# Patient Record
Sex: Female | Born: 1955 | Race: White | Hispanic: No | Marital: Married | Smoking: Never smoker
Health system: Southern US, Community
[De-identification: ages and names within clinical notes are randomized; demographics above are authoritative.]

## PROBLEM LIST (undated history)

## (undated) DIAGNOSIS — R51 Headache: Secondary | ICD-10-CM

## (undated) DIAGNOSIS — F1020 Alcohol dependence, uncomplicated: Secondary | ICD-10-CM

## (undated) DIAGNOSIS — T7840XA Allergy, unspecified, initial encounter: Secondary | ICD-10-CM

## (undated) HISTORY — DX: Alcohol dependence, uncomplicated: F10.20

## (undated) HISTORY — DX: Headache: R51

## (undated) HISTORY — DX: Allergy, unspecified, initial encounter: T78.40XA

## (undated) HISTORY — PX: COLPOSCOPY: SHX161

## (undated) HISTORY — PX: NASAL SEPTUM SURGERY: SHX37

## (undated) HISTORY — PX: TONSILLECTOMY: SUR1361

---

## 2005-01-06 ENCOUNTER — Encounter: Payer: Self-pay | Admitting: Family Medicine

## 2005-06-18 ENCOUNTER — Emergency Department: Payer: Self-pay | Admitting: Emergency Medicine

## 2005-06-27 ENCOUNTER — Ambulatory Visit: Payer: Self-pay

## 2005-12-17 ENCOUNTER — Ambulatory Visit: Payer: Self-pay | Admitting: Family Medicine

## 2006-01-26 ENCOUNTER — Ambulatory Visit: Payer: Self-pay | Admitting: Family Medicine

## 2006-01-26 LAB — CONVERTED CEMR LAB
AST: 25 units/L (ref 0–37)
Alkaline Phosphatase: 60 units/L (ref 39–117)
Calcium: 9.5 mg/dL (ref 8.4–10.5)
Chloride: 107 meq/L (ref 96–112)
Cholesterol: 190 mg/dL (ref 0–200)
Creatinine, Ser: 0.9 mg/dL (ref 0.4–1.2)
GFR calc non Af Amer: 70 mL/min
HDL: 54.2 mg/dL (ref >39.0)
LDL Cholesterol: 122 mg/dL — ABNORMAL HIGH (ref 0–99)
Sodium: 141 meq/L (ref 135–145)

## 2006-01-30 ENCOUNTER — Ambulatory Visit: Payer: Self-pay | Admitting: Family Medicine

## 2006-01-30 ENCOUNTER — Encounter: Payer: Self-pay | Admitting: Family Medicine

## 2006-01-30 ENCOUNTER — Other Ambulatory Visit: Admission: RE | Admit: 2006-01-30 | Discharge: 2006-01-30 | Payer: Self-pay | Admitting: Family Medicine

## 2006-05-21 ENCOUNTER — Encounter: Payer: Self-pay | Admitting: Family Medicine

## 2006-05-21 DIAGNOSIS — R87619 Unspecified abnormal cytological findings in specimens from cervix uteri: Secondary | ICD-10-CM | POA: Insufficient documentation

## 2006-05-21 DIAGNOSIS — A630 Anogenital (venereal) warts: Secondary | ICD-10-CM | POA: Insufficient documentation

## 2006-05-21 DIAGNOSIS — J309 Allergic rhinitis, unspecified: Secondary | ICD-10-CM

## 2006-05-25 ENCOUNTER — Ambulatory Visit: Payer: Self-pay | Admitting: Family Medicine

## 2006-05-25 DIAGNOSIS — K2960 Other gastritis without bleeding: Secondary | ICD-10-CM | POA: Insufficient documentation

## 2006-05-25 DIAGNOSIS — L570 Actinic keratosis: Secondary | ICD-10-CM

## 2006-09-14 ENCOUNTER — Ambulatory Visit: Payer: Self-pay | Admitting: Family Medicine

## 2006-12-09 ENCOUNTER — Ambulatory Visit: Payer: Self-pay | Admitting: Family Medicine

## 2006-12-09 DIAGNOSIS — M25569 Pain in unspecified knee: Secondary | ICD-10-CM

## 2007-03-03 ENCOUNTER — Telehealth: Payer: Self-pay | Admitting: Family Medicine

## 2007-03-03 ENCOUNTER — Ambulatory Visit: Payer: Self-pay | Admitting: Family Medicine

## 2007-03-03 DIAGNOSIS — N949 Unspecified condition associated with female genital organs and menstrual cycle: Secondary | ICD-10-CM | POA: Insufficient documentation

## 2007-03-03 LAB — CONVERTED CEMR LAB
Blood in Urine, dipstick: NEGATIVE
Glucose, Urine, Semiquant: NEGATIVE
Nitrite: NEGATIVE
Protein, U semiquant: NEGATIVE
Urobilinogen, UA: NEGATIVE
WBC Urine, dipstick: NEGATIVE

## 2007-03-05 LAB — CONVERTED CEMR LAB: Chlamydia, DNA Probe: NEGATIVE

## 2007-03-12 ENCOUNTER — Encounter: Admission: RE | Admit: 2007-03-12 | Discharge: 2007-03-12 | Payer: Self-pay | Admitting: Family Medicine

## 2007-03-18 ENCOUNTER — Encounter: Admission: RE | Admit: 2007-03-18 | Discharge: 2007-03-18 | Payer: Self-pay | Admitting: Family Medicine

## 2007-03-19 ENCOUNTER — Ambulatory Visit: Payer: Self-pay | Admitting: Family Medicine

## 2007-10-07 ENCOUNTER — Ambulatory Visit: Payer: Self-pay | Admitting: Family Medicine

## 2008-02-02 ENCOUNTER — Ambulatory Visit: Payer: Self-pay | Admitting: Family Medicine

## 2008-02-02 DIAGNOSIS — R1013 Epigastric pain: Secondary | ICD-10-CM | POA: Insufficient documentation

## 2008-02-04 LAB — CONVERTED CEMR LAB
ALT: 21 units/L (ref 0–35)
AST: 27 units/L (ref 0–37)
Albumin: 3.9 g/dL (ref 3.5–5.2)
Alkaline Phosphatase: 73 units/L (ref 39–117)
Basophils Relative: 2.9 % (ref 0.0–3.0)
Calcium: 9.4 mg/dL (ref 8.4–10.5)
Eosinophils Absolute: 0.8 10*3/uL — ABNORMAL HIGH (ref 0.0–0.7)
GFR calc Af Amer: 84 mL/min
HCT: 39.4 % (ref 36.0–46.0)
Lymphocytes Relative: 30.4 % (ref 12.0–46.0)
MCHC: 33.4 g/dL (ref 30.0–36.0)
MCV: 86.3 fL (ref 78.0–100.0)
Monocytes Absolute: 0.6 10*3/uL (ref 0.1–1.0)
Monocytes Relative: 8.4 % (ref 3.0–12.0)
Neutro Abs: 3.1 10*3/uL (ref 1.4–7.7)
Platelets: 158 10*3/uL (ref 150–400)
Potassium: 4.5 meq/L (ref 3.5–5.1)
Total Protein: 6.6 g/dL (ref 6.0–8.3)

## 2008-03-06 ENCOUNTER — Ambulatory Visit: Payer: Self-pay | Admitting: Family Medicine

## 2008-03-07 ENCOUNTER — Telehealth: Payer: Self-pay | Admitting: Family Medicine

## 2008-04-12 ENCOUNTER — Ambulatory Visit: Payer: Self-pay | Admitting: Family Medicine

## 2008-04-12 DIAGNOSIS — A088 Other specified intestinal infections: Secondary | ICD-10-CM | POA: Insufficient documentation

## 2008-04-20 ENCOUNTER — Telehealth: Payer: Self-pay | Admitting: Family Medicine

## 2008-05-18 ENCOUNTER — Telehealth: Payer: Self-pay | Admitting: Family Medicine

## 2008-10-25 ENCOUNTER — Ambulatory Visit: Payer: Self-pay | Admitting: Family Medicine

## 2008-10-25 DIAGNOSIS — H109 Unspecified conjunctivitis: Secondary | ICD-10-CM | POA: Insufficient documentation

## 2008-11-03 ENCOUNTER — Telehealth: Payer: Self-pay | Admitting: Family Medicine

## 2008-11-09 ENCOUNTER — Ambulatory Visit: Payer: Self-pay | Admitting: Family Medicine

## 2008-11-15 ENCOUNTER — Telehealth: Payer: Self-pay | Admitting: Family Medicine

## 2008-11-15 DIAGNOSIS — H538 Other visual disturbances: Secondary | ICD-10-CM

## 2008-11-15 DIAGNOSIS — H5710 Ocular pain, unspecified eye: Secondary | ICD-10-CM | POA: Insufficient documentation

## 2008-12-19 ENCOUNTER — Ambulatory Visit: Payer: Self-pay | Admitting: Family Medicine

## 2008-12-19 DIAGNOSIS — J329 Chronic sinusitis, unspecified: Secondary | ICD-10-CM | POA: Insufficient documentation

## 2009-01-24 ENCOUNTER — Ambulatory Visit: Payer: Self-pay | Admitting: Family Medicine

## 2009-01-25 ENCOUNTER — Encounter: Payer: Self-pay | Admitting: Family Medicine

## 2009-01-25 LAB — CONVERTED CEMR LAB
AST: 27 units/L (ref 0–37)
Albumin: 4 g/dL (ref 3.5–5.2)
Alkaline Phosphatase: 71 units/L (ref 39–117)
Basophils Absolute: 0 10*3/uL (ref 0.0–0.1)
Basophils Relative: 0.5 % (ref 0.0–3.0)
Calcium: 9.4 mg/dL (ref 8.4–10.5)
Eosinophils Relative: 11 % — ABNORMAL HIGH (ref 0.0–5.0)
GFR calc non Af Amer: 79.45 mL/min (ref >60)
Hemoglobin: 13 g/dL (ref 12.0–15.0)
LDL Cholesterol: 119 mg/dL — ABNORMAL HIGH (ref 0–99)
Lymphocytes Relative: 37.5 % (ref 12.0–46.0)
Monocytes Relative: 9.4 % (ref 3.0–12.0)
Neutrophils Relative %: 41.6 % — ABNORMAL LOW (ref 43.0–77.0)
Platelets: 144 10*3/uL — ABNORMAL LOW (ref 150.0–400.0)
Potassium: 4.3 meq/L (ref 3.5–5.1)
Total Protein: 6.8 g/dL (ref 6.0–8.3)
VLDL: 20 mg/dL (ref 0.0–40.0)
Vit D, 25-Hydroxy: 36 ng/mL (ref 30–89)
WBC: 4.8 10*3/uL (ref 4.5–10.5)

## 2009-01-26 LAB — CONVERTED CEMR LAB: Free T4: 0.7 ng/dL (ref 0.6–1.6)

## 2009-02-02 ENCOUNTER — Ambulatory Visit: Payer: Self-pay | Admitting: Family Medicine

## 2009-02-02 ENCOUNTER — Other Ambulatory Visit: Admission: RE | Admit: 2009-02-02 | Discharge: 2009-02-02 | Payer: Self-pay | Admitting: Family Medicine

## 2009-02-06 ENCOUNTER — Encounter (INDEPENDENT_AMBULATORY_CARE_PROVIDER_SITE_OTHER): Payer: Self-pay | Admitting: *Deleted

## 2009-02-06 LAB — CONVERTED CEMR LAB: Pap Smear: NEGATIVE

## 2009-02-13 ENCOUNTER — Encounter: Admission: RE | Admit: 2009-02-13 | Discharge: 2009-02-13 | Payer: Self-pay | Admitting: Family Medicine

## 2009-02-13 LAB — HM MAMMOGRAPHY: HM Mammogram: NEGATIVE

## 2009-03-02 ENCOUNTER — Encounter: Payer: Self-pay | Admitting: Family Medicine

## 2009-03-05 ENCOUNTER — Encounter (INDEPENDENT_AMBULATORY_CARE_PROVIDER_SITE_OTHER): Payer: Self-pay | Admitting: *Deleted

## 2009-03-06 ENCOUNTER — Ambulatory Visit: Payer: Self-pay | Admitting: Internal Medicine

## 2009-03-20 ENCOUNTER — Ambulatory Visit: Payer: Self-pay | Admitting: Internal Medicine

## 2009-03-20 LAB — HM COLONOSCOPY

## 2009-03-21 ENCOUNTER — Encounter: Payer: Self-pay | Admitting: Internal Medicine

## 2009-04-18 ENCOUNTER — Ambulatory Visit: Payer: Self-pay | Admitting: Otolaryngology

## 2010-02-05 NOTE — Miscellaneous (Signed)
Summary: [facility]  Clinical Lists Changes  Medications: Added new medication of MIRALAX   POWD (POLYETHYLENE GLYCOL 3350) As per prep  instructions. - Signed Added new medication of DULCOLAX 5 MG  TBEC (BISACODYL) Day before procedure take 2 at 3pm and 2 at 8pm. - Signed Added new medication of REGLAN 10 MG  TABS (METOCLOPRAMIDE HCL) As per prep instructions. - Signed Rx of MIRALAX   POWD (POLYETHYLENE GLYCOL 3350) As per prep  instructions.;  #255gm x 0;  Signed;  Entered by: Ezra Sites RN;  Authorized by: Hart Carwin MD;  Method used: Electronically to [facility]. [facility] [phone]*, [address], [place], [place]  [address], Ph: [phone], Fax: [phone] Rx of DULCOLAX 5 MG  TBEC (BISACODYL) Day before procedure take 2 at 3pm and 2 at 8pm.;  #4 x 0;  Signed;  Entered by: Ezra Sites RN;  Authorized by: Hart Carwin MD;  Method used: Electronically to [facility] [phone]*, [address] [place], [place]  [address], Ph: [phone], Fax: [phone] Rx of REGLAN 10 MG  TABS (METOCLOPRAMIDE HCL) As per prep instructions.;  #2 x 0;  Signed;  Entered by: Ezra Sites RN;  Authorized by: Hart Carwin MD;  Method used: Electronically to [facility] [phone]*, [address], [place], [place]  [address], Ph: [phone], Fax: [phone] Observations: Added new observation of ALLERGY REV: Done (03/06/2009 8:51)    Prescriptions: REGLAN 10 MG  TABS (METOCLOPRAMIDE HCL) As per prep instructions.  #2 x 0   Entered by:   Ezra Sites RN   Authorized by:   Hart Carwin MD   Signed by:   Ezra Sites RN on 03/06/2009   Method used:   Electronically to        [facility]. [address] [phone]* (retail)       [address] [place], [place]  [address]       Ph: [phone]       Fax: [phone]   RxID:   [id number] DULCOLAX 5 MG  TBEC (BISACODYL) Day before procedure take 2 at 3pm and 2 at 8pm.  #4 x 0   Entered by:   Ezra Sites RN   Authorized by:    Hart Carwin MD   Signed by:   Ezra Sites RN on 03/06/2009   Method used:   Electronically to        [facility]. [address] [phone]* (retail)       [address] [place], [place]  [address]       Ph: [phone]       Fax: [phone]   RxID:   [id number] MIRALAX   POWD (POLYETHYLENE GLYCOL 3350) As per prep  instructions.  #255gm x 0   Entered by:   Ezra Sites RN   Authorized by:   Hart Carwin MD   Signed by:   Ezra Sites RN on 03/06/2009   Method used:   Electronically to        [facility]. [address] [phone]* (retail)       [address] [place], [place]  [address]       Ph: [phone]       Fax: [phone]   RxID:   [id number]

## 2010-02-05 NOTE — Letter (Signed)
Summary: Patient Notice- Polyp Results  [facility]  [address] [place], [place] [address]   Phone: [phone]  Fax: [phone]        March 21, 2009 MRN: [id number]    [name] [name] [address] [place], [place]  [address]    Dear [name],  I am pleased to inform you that the colon polyp(s) removed during your recent colonoscopy was (were) found to be benign (no cancer detected) upon pathologic examination.The polyp was not precancerous.  I recommend you have a repeat colonoscopy examination in 10_ years to look for recurrent polyps, as having colon polyps increases your risk for having recurrent polyps or even colon cancer in the future.  Should you develop new or worsening symptoms of abdominal pain, bowel habit changes or bleeding from the rectum or bowels, please schedule an evaluation with either your primary care physician or with me.  Additional information/recommendations:  _x_ No further action with gastroenterology is needed at this time. Please      follow-up with your primary care physician for your other healthcare      needs.  __ Please call [phone] to schedule a return visit to review your      situation.  __ Please keep your follow-up visit as already scheduled.  __ Continue treatment plan as outlined the day of your exam.  Please call us if you are having persistent problems or have questions about your condition that have not been fully answered at this time.  Sincerely,  Hart Carwin MD  This letter has been electronically signed by your physician.  Appended Document: Patient Notice- Polyp Results letter mailed 3.18.11

## 2010-02-05 NOTE — Letter (Signed)
Summary: [facility] Instructions  [facility]  [address] [place], [place] [address]   Phone: [phone]  Fax: [phone]       [name] [name]    14-Nov-1955    MRN: [id number]       Procedure Day /Date:  Tuesday 03/20/2009     Arrival Time: 7:30 am     Procedure Time: 8:30 am     Location of Procedure:                    _x _  [facility] (4th Floor)    PREPARATION FOR COLONOSCOPY WITH MIRALAX  Starting 5 days prior to your procedure Thursday 3/10 do not eat nuts, seeds, popcorn, corn, beans, peas,  salads, or any raw vegetables.  Do not take any fiber supplements (e.g. Metamucil, Citrucel, and Benefiber). ____________________________________________________________________________________________________   THE DAY BEFORE YOUR PROCEDURE         DATE: Monday 3/14  1   Drink clear liquids the entire day-NO SOLID FOOD  2   Do not drink anything colored red or purple.  Avoid juices with pulp.  No orange juice.  3   Drink at least 64 oz. (8 glasses) of fluid/clear liquids during the day to prevent dehydration and help the prep work efficiently.  CLEAR LIQUIDS INCLUDE: Water Jello Ice Popsicles Tea (sugar ok, no milk/cream) Powdered fruit flavored drinks Coffee (sugar ok, no milk/cream) Gatorade Juice: apple, white grape, white cranberry  Lemonade Clear bullion, consomm, broth Carbonated beverages (any kind) Strained chicken noodle soup Hard Candy  4   Mix the entire bottle of Miralax with 64 oz. of Gatorade/Powerade in the morning and put in the refrigerator to chill.  5   At 3:00 pm take 2 Dulcolax/Bisacodyl tablets.  6   At 4:30 pm take one Reglan/Metoclopramide tablet.  7  Starting at 5:00 pm drink one 8 oz glass of the Miralax mixture every 15-20 minutes until you have finished drinking the entire 64 oz.  You should finish drinking prep around 7:30 or 8:00 pm.  8   If you are nauseated, you may take the 2nd Reglan/Metoclopramide  tablet at 6:30 pm.        9    At 8:00 pm take 2 more DULCOLAX/Bisacodyl tablets.     THE DAY OF YOUR PROCEDURE      DATE:  Tuesday 3/15  You may drink clear liquids until 6:30 am   (2 HOURS BEFORE PROCEDURE).   MEDICATION INSTRUCTIONS  Unless otherwise instructed, you should take regular prescription medications with a small sip of water as early as possible the morning of your procedure.          OTHER INSTRUCTIONS  You will need a responsible adult at least 55 years of age to accompany you and drive you home.   This person must remain in the waiting room during your procedure.  Wear loose fitting clothing that is easily removed.  Leave jewelry and other valuables at home.  However, you may wish to bring a book to read or an iPod/MP3 player to listen to music as you wait for your procedure to start.  Remove all body piercing jewelry and leave at home.  Total time from sign-in until discharge is approximately 2-3 hours.  You should go home directly after your procedure and rest.  You can resume normal activities the day after your procedure.  The day of your procedure you should not:   Drive  Make legal decisions   Operate machinery   Drink alcohol   Return to work  You will receive specific instructions about eating, activities and medications before you leave.   The above instructions have been reviewed and explained to me by   Ezra Sites RN  March 06, 2009 9:15 AM     I fully understand and can verbalize these instructions _____________________________ Date _______

## 2010-02-05 NOTE — Assessment & Plan Note (Signed)
Summary: CPX AND PAP/DLO   Vital Signs:  Patient profile:   55 year old female Weight:      136 pounds BMI:     24.96 Temp:     98.1 degrees F oral Pulse rate:   60 / minute Pulse rhythm:   regular BP sitting:   96 / 60  (right arm) Cuff size:   regular  Vitals Entered By: Linde Gillis CMA Duncan Dull) (February 02, 2009 2:04 PM) CC: 30 minute exam with pap   History of Present Illness: Saw ENT.Marland Kitchentreated with prednisone and did sinus CT..showed chronic siunusitis.Marland Kitchenon 1 month of antibiotics. Still with left face pressure and ear pain. On avelox 400 mg daily. Naris nose spray.   Doing well otherwise.  Problems Prior to Update: 1)  Screening, Colon Cancer  (ICD-V76.51) 2)  Other Screening Mammogram  (ICD-V76.12) 3)  Hypothyroidism  (ICD-244.9) 4)  Screening For Lipoid Disorders  (ICD-V77.91) 5)  Sinusitis, Recurrent  (ICD-473.9) 6)  Blurred Vision  (ICD-368.8) 7)  Eye Pain  (ICD-379.91) 8)  Conjunctivitis, Right  (ICD-372.30) 9)  Gastroenteritis, Viral, Acute  (ICD-008.8) 10)  Epigastric Pain  (ICD-789.06) 11)  Adnexal Pain  (ICD-625.9) 12)  Knee Pain, Right, Acute  (ICD-719.46) 13)  Keratosis, Actinic  (ICD-702.0) 14)  Gastritis Nec w/o Hemorrhage  (ICD-535.40) 15)  Abnormal Pap Smear  (ICD-795.00) 16)  Condyloma Acuminata  (ICD-078.11) 17)  Allergic Rhinitis  (ICD-477.9)  Current Medications (verified): 1)  Multivitamins  Tabs (Multiple Vitamin) .Marland Kitchen.. 1 By Mouth Daily 2)  Calcium Carbonate 600 Mg Tabs (Calcium Carbonate) .Marland Kitchen.. 1 By Mouth Daily 3)  Simply Saline 0.9 %  Aers (Saline) .... As Needed 4)  Prilosec 20 Mg Cpdr (Omeprazole) .... As Needed 5)  Clarinex 5 Mg Tabs (Desloratadine) .Marland Kitchen.. 1 By Mouth Daily 6)  Fluticasone Propionate 50 Mcg/act Susp (Fluticasone Propionate) .... 2 Sprays Per Nostril Daily As Needed 7)  Avelox 400 Mg Tabs (Moxifloxacin Hcl) .... Take One Tablet By Mouth For 28 Days 8)  Neomycin-Polymyxin-Dexameth 5-10000-0.1 Oint  (Neomycin-Polymyxin-Dexameth) .... Apply Twice Daily To Eye Lids  Allergies: 1)  ! Penicillin G Potassium (Penicillin G Potassium)  Past History:  Past medical, surgical, family and social histories (including risk factors) reviewed, and no changes noted (except as noted below).  Past Medical History: Reviewed history from 09/14/2006 and no changes required. Allergic rhinitis  Past Surgical History: Reviewed history from 05/21/2006 and no changes required. 1974 Deviated septum surgery 1964 Tonsillectomy Colposcopy for abnormal Pap.  Family History: Reviewed history from 05/21/2006 and no changes required. Father: Alive 44 Alzheimer's, MI at age 9 Mother: Died 35 lung CA Siblings: 2 brothers, 2 sisters - healthy  Social History: Reviewed history from 05/21/2006 and no changes required. Marital Status: domestic partner (gay) 17 year relationship Children: None Occupation: Psychiatric nurse Exercise:  Walks 3-4 x/week Diet:  + lots of sugar, fruits, veggies, +H20 Never Smoked Alcohol use-no Drug use-no  Review of Systems General:  Denies fatigue and fever. CV:  Denies chest pain or discomfort. Resp:  Denies shortness of breath. GI:  Denies abdominal pain. GU:  Denies dysuria. Derm:  Denies lesion(s); occ rash on buttocks clear blisters mild tender intermittant on buttocks. Does not bother her that much. LAst 5-7 days. No pus, no redness..has been going on since a child. Marland Kitchen Psych:  Denies anxiety and depression.  Physical Exam  General:  Well-developed,well-nourished,in no acute distress; alert,appropriate and cooperative throughout examination Head:  ttp left face Ears:  clear fluid left TM, R  vlear Nose:  External nasal examination shows no deformity or inflammation. Nasal mucosa are pink and moist without lesions or exudates. Mouth:  Oral mucosa and oropharynx without lesions or exudates.  Teeth in good repair. Neck:  no carotid bruit or thyromegaly no cervical or  supraclavicular lymphadenopathy  Chest Wall:  No deformities, masses, or tenderness noted. Breasts:  No mass, nodules, thickening, tenderness, bulging, retraction, inflamation, nipple discharge or skin changes noted.   Lungs:  Normal respiratory effort, chest expands symmetrically. Lungs are clear to auscultation, no crackles or wheezes. Heart:  Normal rate and regular rhythm. S1 and S2 normal without gallop, murmur, click, rub or other extra sounds. Abdomen:  Bowel sounds positive,abdomen soft and non-tender without masses, organomegaly or hernias noted. Genitalia:  Pelvic Exam:        External: normal female genitalia without lesions or masses        Vagina: normal without lesions or masses        Cervix: normal without lesions or masses        Adnexa: normal bimanual exam without masses or fullness        Uterus: normal by palpation        Pap smear: performed Pulses:  R and L posterior tibial pulses are full and equal bilaterally  Extremities:  no edema Skin:  erythematous dry macules on right buttock.Roanna Epley not interested in eval further at this time...no blisters to send for viral culture.    Impression & Recommendations:  Problem # 1:  Preventive Health Care (ICD-V70.0) Reviewed preventive care protocols, scheduled due services, and updated immunizations. Encouraged exercise, weight loss, healthy eating habits.   Problem # 2:  HYPOTHYROIDISM (ICD-244.9) subclinical..recehck in 1 year.  Problem # 3:  SINUSITIS, RECURRENT (ICD-473.9) Per ENT.Marland Kitchen1 month of antibitoics.  Her updated medication list for this problem includes:    Simply Saline 0.9 % Aers (Saline) .Marland Kitchen... As needed    Fluticasone Propionate 50 Mcg/act Susp (Fluticasone propionate) .Marland Kitchen... 2 sprays per nostril daily as needed    Avelox 400 Mg Tabs (Moxifloxacin hcl) .Marland Kitchen... Take one tablet by mouth for 28 days  Problem # 4:  ROUTINE GYNECOLOGICAL EXAMINATION (ICD-V72.31) PAP pending.   Complete Medication List: 1)   Multivitamins Tabs (Multiple vitamin) .Marland Kitchen.. 1 by mouth daily 2)  Calcium Carbonate 600 Mg Tabs (Calcium carbonate) .Marland Kitchen.. 1 by mouth daily 3)  Simply Saline 0.9 % Aers (Saline) .... As needed 4)  Prilosec 20 Mg Cpdr (Omeprazole) .... As needed 5)  Clarinex 5 Mg Tabs (Desloratadine) .Marland Kitchen.. 1 by mouth daily 6)  Fluticasone Propionate 50 Mcg/act Susp (Fluticasone propionate) .... 2 sprays per nostril daily as needed 7)  Avelox 400 Mg Tabs (Moxifloxacin hcl) .... Take one tablet by mouth for 28 days 8)  Neomycin-polymyxin-dexameth 5-10000-0.1 Oint (Neomycin-polymyxin-dexameth) .... Apply twice daily to eye lids  Other Orders: Radiology Referral (Radiology) Orthopedic Surgeon Referral (Ortho Surgeon) Gastroenterology Referral (GI)  Patient Instructions: 1)  Referral Appointment Information 2)  Day/Date: 3)  Time: 4)  Place/MD: 5)  Address: 6)  Phone/Fax: 7)  Patient given appointment information. Information/Orders faxed/mailed.  8)  Please schedule a follow-up appointment in 1 year.   Current Allergies (reviewed today): ! PENICILLIN G POTASSIUM (PENICILLIN G POTASSIUM)  Last Flu Vaccine:  Fluvax 3+ (11/06/2005 5:32:16 PM) Flu Vaccine Next Due:  Refused Flex Sig Next Due:  Not Indicated Hemoccult Next Due:  Not Indicated

## 2010-02-05 NOTE — Letter (Signed)
Summary: [facility]  [facility]   Imported By: Lanelle Bal 03/20/2009 08:48:39  _____________________________________________________________________  External Attachment:    Type:   Image     Comment:   External Document

## 2010-02-05 NOTE — Letter (Signed)
Summary: [facility]  [facility]   Imported By: Lanelle Bal 02/08/2009 08:47:36  _____________________________________________________________________  External Attachment:    Type:   Image     Comment:   External Document

## 2010-02-05 NOTE — Procedures (Signed)
Summary: Colonoscopy  Patient: [name] [name] Note: All result statuses are Final unless otherwise noted.  Tests: (1) Colonoscopy (COL)   COL Colonoscopy           DONE     [facility]     [address]     [place], [place]  [address]           COLONOSCOPY PROCEDURE REPORT           PATIENT:  [name], [name]  MR#:  [id number]     BIRTHDATE:  05-23-1955, 54 yrs. old  GENDER:  female           ENDOSCOPIST:  Hedwig Morton. Juanda Chance, MD     Referred by:  Excell Seltzer, M.D.           PROCEDURE DATE:  03/20/2009     PROCEDURE:  Colonoscopy 19[address]     ASA CLASS:  Class I     INDICATIONS:  Routine Risk Screening           MEDICATIONS:   Versed 8 mg, Fentanyl 75 mcg           DESCRIPTION OF PROCEDURE:   After the risks benefits and     alternatives of the procedure were thoroughly explained, informed     consent was obtained.  Digital rectal exam was performed and     revealed no rectal masses.   The LB [id number] endoscope     was introduced through the anus and advanced to the cecum, which     was identified by both the appendix and ileocecal valve, without     limitations.  The quality of the prep was good, using MiraLax.     The instrument was then slowly withdrawn as the colon was fully     examined.     <<PROCEDUREIMAGES>>           FINDINGS:  A diminutive polyp was found in the descending colon. 3     mm polyp at 75 cm removed The polyp was removed using cold biopsy     forceps (see image3).  This was otherwise a normal examination of     the colon (see image4, image2, and image1).   Retroflexed views in     the rectum revealed no abnormalities.    The scope was then     withdrawn from the patient and the procedure completed.           COMPLICATIONS:  None           ENDOSCOPIC IMPRESSION:     1) Diminutive polyp in the descending colon     2) Otherwise normal examination     RECOMMENDATIONS:     1) Await pathology results           REPEAT EXAM:  In 10 year(s)  for.           ______________________________     Hedwig Morton. Juanda Chance, MD           CC:           n.     eSIGNED:   Hedwig Morton. Rahaf Carbonell at 03/20/2009 09:05 AM           [name] [name], [id number]  Note: An exclamation mark (!) indicates a result that was not dispersed into the flowsheet. Document Creation Date: 03/20/2009 9:05 AM _______________________________________________________________________  (1) Order result status: Final Collection or observation date-time: 03/20/2009 08:59 Requested date-time:  Receipt date-time:  Reported date-time:  Referring Physician:   Ordering Physician: Lina Sar [phone]) Specimen Source:  Source: Launa Grill Order Number: [id number] Lab site:   Appended Document: Colonoscopy     Procedures Next Due Date:    Colonoscopy: 03/2019

## 2010-02-05 NOTE — Letter (Signed)
Summary: Results Follow up Letter  [facility]  [address] [place], [place] [address]   Phone: [phone]  Fax: [phone]    02/06/2009 MRN: [id number]    [name] [name] [address] [place], [place]  [address]    Dear Ms. Hudler,  The following are the results of your recent test(s):  Test         Result    Pap Smear:        Normal __X___  Not Normal _____ Comments:  Repeat in 1 year. ______________________________________________________ Cholesterol: LDL(Bad cholesterol):         Your goal is less than:         HDL (Good cholesterol):       Your goal is more than: Comments:  ______________________________________________________ Mammogram:        Normal _____  Not Normal _____ Comments:  ___________________________________________________________________ Hemoccult:        Normal _____  Not normal _______ Comments:    _____________________________________________________________________ Other Tests:    We routinely do not discuss normal results over the telephone.  If you desire a copy of the results, or you have any questions about this information we can discuss them at your next office visit.   Sincerely,    Kerby Nora, M.D.  AEB:lsf

## 2010-03-08 ENCOUNTER — Other Ambulatory Visit: Payer: Self-pay | Admitting: Family Medicine

## 2010-03-08 ENCOUNTER — Ambulatory Visit (INDEPENDENT_AMBULATORY_CARE_PROVIDER_SITE_OTHER): Payer: BC Managed Care – PPO | Admitting: Family Medicine

## 2010-03-08 ENCOUNTER — Encounter: Payer: Self-pay | Admitting: Family Medicine

## 2010-03-08 DIAGNOSIS — R609 Edema, unspecified: Secondary | ICD-10-CM | POA: Insufficient documentation

## 2010-03-08 LAB — HEPATIC FUNCTION PANEL
AST: 23 U/L (ref 0–37)
Albumin: 4.4 g/dL (ref 3.5–5.2)

## 2010-03-08 LAB — TSH: TSH: 5.11 u[IU]/mL (ref 0.35–5.50)

## 2010-03-26 NOTE — Assessment & Plan Note (Signed)
Summary: RETAINING FLUID,SWOLLEN SIDE OF FACE/CLE  PT SAW DR SCOTT BEN...   Vital Signs:  Patient profile:   55 year old female Height:      62 inches Weight:      136 pounds BMI:     24.96 Temp:     98.1 degrees F oral Pulse rate:   60 / minute Pulse rhythm:   regular BP sitting:   90 / 60  (left arm) Cuff size:   regular  Vitals Entered By: Benny Lennert CMA Duncan Dull) (March 08, 2010 9:51 AM)  History of Present Illness: Chief complaint Retaining fluid swollen in face   55 year old female with  hx of hypothyroidism and allergies presents with facial swelling.  On no medicaiton for hypothyroid as free T3 and T 4 at goal 01/2009.  Had sinus surgery last year...had been ding well without sinus infection until 2 week ago.  Allso now getting allergy shots. Was placed on levaquin .. finished 1 week ago.  Always associated facial swelling with sinus infections, but... Had repeat CT sinuses yesterday.. no infeciton seen. Had blood allergies pending and BMET.  Has associated swelling in ankles, headaches. No SOB,  one episode of chest pressure.. resting, momentary Very fatigued lately... feels worse when working out.  BP looks great today.  Problems Prior to Update: 1)  Routine Gynecological Examination  (ICD-V72.31) 2)  Preventive Health Care  (ICD-V70.0) 3)  Screening, Colon Cancer  (ICD-V76.51) 4)  Other Screening Mammogram  (ICD-V76.12) 5)  Hypothyroidism  (ICD-244.9) 6)  Screening For Lipoid Disorders  (ICD-V77.91) 7)  Sinusitis, Recurrent  (ICD-473.9) 8)  Blurred Vision  (ICD-368.8) 9)  Eye Pain  (ICD-379.91) 10)  Conjunctivitis, Right  (ICD-372.30) 11)  Gastroenteritis, Viral, Acute  (ICD-008.8) 12)  Epigastric Pain  (ICD-789.06) 13)  Adnexal Pain  (ICD-625.9) 14)  Knee Pain, Right, Acute  (ICD-719.46) 15)  Keratosis, Actinic  (ICD-702.0) 16)  Gastritis Nec w/o Hemorrhage  (ICD-535.40) 17)  Abnormal Pap Smear  (ICD-795.00) 18)  Condyloma Acuminata   (ICD-078.11) 19)  Allergic Rhinitis  (ICD-477.9)  Current Medications (verified): 1)  Multivitamins  Tabs (Multiple Vitamin) .Marland Kitchen.. 1 By Mouth Daily 2)  Calcium Carbonate 600 Mg Tabs (Calcium Carbonate) .Marland Kitchen.. 1 By Mouth Daily 3)  Simply Saline 0.9 %  Aers (Saline) .... As Needed 4)  Claritin 10 Mg Tabs (Loratadine) .... Take One Tablet Daily 5)  Pepcid Ac 10 Mg Tabs (Famotidine) .... One Tablet Daily  Allergies: 1)  ! Penicillin G Potassium (Penicillin G Potassium)  Past History:  Past medical, surgical, family and social histories (including risk factors) reviewed, and no changes noted (except as noted below).  Past Medical History: Reviewed history from 09/14/2006 and no changes required. Allergic rhinitis  Past Surgical History: Reviewed history from 05/21/2006 and no changes required. 1974 Deviated septum surgery 1964 Tonsillectomy Colposcopy for abnormal Pap.  Family History: Reviewed history from 05/21/2006 and no changes required. Father: Alive 61 Alzheimer's, MI at age 72 Mother: Died 39 lung CA Siblings: 2 brothers, 2 sisters - healthy  Social History: Reviewed history from 05/21/2006 and no changes required. Marital Status: domestic partner (gay) 17 year relationship Children: None Occupation: Psychiatric nurse Exercise:  Walks 3-4 x/week Diet:  + lots of sugar, fruits, veggies, +H20 Never Smoked Alcohol use-no Drug use-no  Review of Systems General:  Complains of fatigue. CV:  Denies chest pain or discomfort. Resp:  Denies shortness of breath. GI:  Denies abdominal pain and bloody stools. GU:  Denies  dysuria.  Physical Exam  General:  Well-developed,well-nourished,in no acute distress; alert,appropriate and cooperative throughout examination Head:  Mild pugffiness arpound eyes and in cheeks  No maxillary sinus ttp Ears:  External ear exam shows no significant lesions or deformities.  Otoscopic examination reveals clear canals, tympanic membranes are intact  bilaterally without bulging, retraction, inflammation or discharge. Hearing is grossly normal bilaterally. Nose:  External nasal examination shows no deformity or inflammation. Nasal mucosa are pink and moist without lesions or exudates. Mouth:  Oral mucosa and oropharynx without lesions or exudates.  Teeth in good repair. Neck:  no carotid bruit or thyromegaly no cervical or supraclavicular lymphadenopathy  Lungs:  Normal respiratory effort, chest expands symmetrically. Lungs are clear to auscultation, no crackles or wheezes. Heart:  Normal rate and regular rhythm. S1 and S2 normal without gallop, murmur, click, rub or other extra sounds. Pulses:  R and L posterior tibial pulses are full and equal bilaterally  Extremities:  no edema   Impression & Recommendations:  Problem # 1:  EDEMA (ICD-782.3) Facial and periphearl.. EKG: NSR, no ST changes, no Q waves, No LVH. Will begin with evaluation with labs.. ? thyroid dysfunction.   No clear ENT origin per her ENT MD.. sinus CT showed no ongaoing chronic sinus infection aafter antibiotic treatment. Will obtain copy of allergy testing results and other labs pending from ENT.  Orders: EKG w/ Interpretation (93000) TLB-TSH (Thyroid Stimulating Hormone) (84443-TSH) TLB-T3, Free (Triiodothyronine) (84481-T3FREE) TLB-T4 (Thyrox), Free [id number]) TLB-Hepatic/Liver Function Pnl (80076-HEPATIC)  Complete Medication List: 1)  Multivitamins Tabs (Multiple vitamin) .Marland Kitchen.. 1 by mouth daily 2)  Calcium Carbonate 600 Mg Tabs (Calcium carbonate) .Marland Kitchen.. 1 by mouth daily 3)  Simply Saline 0.9 % Aers (Saline) .... As needed 4)  Claritin 10 Mg Tabs (Loratadine) .... Take one tablet daily 5)  Pepcid Ac 10 Mg Tabs (Famotidine) .... One tablet daily  Patient Instructions: 1)  We will call with lab results. Please get a copy of labs from ENT MD.    Orders Added: 1)  EKG w/ Interpretation [93000] 2)  Est. Patient Level III [40981] 3)  TLB-TSH (Thyroid  Stimulating Hormone) [84443-TSH] 4)  TLB-T3, Free (Triiodothyronine) [19147-W2NFAO] 5)  TLB-T4 (Thyrox), Free [13086-VH8I] 6)  TLB-Hepatic/Liver Function Pnl [80076-HEPATIC]    Current Allergies (reviewed today): ! PENICILLIN G POTASSIUM (PENICILLIN G POTASSIUM)

## 2010-05-24 NOTE — Assessment & Plan Note (Signed)
[facility]                           [place] OFFICE NOTE   NAME:[name], [name] ALLES                   MRN:          [id number]  DATE:12/17/2005                            DOB:          Apr 07, 1955    CHIEF COMPLAINT:  A 55 year old white female here to establish new  doctor.   HISTORY OF PRESENT ILLNESS:  Ms. [name] [name] states that she has  been having problems with sinus issues for about 2 weeks and was  diagnosed with a sinus infection/ear infection, at Urgent Care about a  week ago.  She was given Levaquin.  She also states that she has been  using guaifenesin in the form of Guaifenex, as well as nasal saline.  She states her symptoms are significantly improved.   She states that she has had occasional boils on her buttocks off and on  over the past few months that seem to occur with sweating with activity.  She denies any acne anywhere else.  She denies any current boils at this  point in time.   REVIEW OF SYSTEMS:  No headache, no dizziness, no syncope.  No chest  pain, no dyspnea.  No nausea, vomiting, diarrhea, constipation, or  rectal bleeding.  No myalgia or arthralgia.   PAST MEDICAL HISTORY:  1. Allergic rhinitis.  2. Abnormal Pap.  3. Genital warts status post cauterization.   HOSPITALIZATIONS, SURGERIES, PROCEDURES:  1. In 1974, deviated septum surgery.  2. In 1964, tonsillectomy.  3. Colposcopy.  4. Mammogram December 2006 negative.  5. Pap smear January 2007 negative.   ALLERGIES:  PENICILLIN causes rash.   MEDICATIONS:  1. Multivitamin daily.  2. Calcium and vitamin D daily.   FAMILY HISTORY:  Father alive at age 31 with Alzheimer's, MI at age 63.  Mother deceased at age 47 with lung cancer.  She has two brothers and  two sisters who are healthy.  There is no family history of any type of  cancer, diabetes, or high blood pressure.   SOCIAL HISTORY:  She works in a Psychiatric nurse.  She has a domestic  partner and is homosexual.  She has been in this relationship for 17  years and states that it is going very well.  She does not have any  children.  She walks 3-4 times per week.  She tries to eat fruits and  vegetables and get lots of water, but she also states she does eat a lot  of sugar.  No smoking, no alcohol use.  History of cocaine and marijuana  use 20 years ago, none currently.   PHYSICAL EXAMINATION:  VITAL SIGNS:  Height 62 inches, weight 134.6,  blood pressure 104/80, pulse 80, temperature 98.1.  GENERAL:  Healthy-appearing female in no apparent distress.  HEENT:  PERRLA, extraocular muscles intact, oropharynx clear, tympanic  membranes clear, nares clear.  No thyromegaly, no lymphadenopathy  supraclavicular or cervical.  CARDIOVASCULAR:  Regular rate and rhythm.  No murmurs, rubs or gallops.  Normal PMI, 2+ peripheral pulses, no peripheral edema.  LUNGS:  Clear to auscultation bilaterally.  No wheezes, rales, or  rhonchi.  ABDOMEN:  Soft, nontender, normal active bowel sounds, no  hepatosplenomegaly.  MUSCULOSKELETAL:  Strength 5/5 in upper and lower extremities.  NEUROLOGIC:  Alert and oriented x3.  Cranial nerves II-XII grossly  intact.  Reflexes 2+.   ASSESSMENT AND PLAN:  1. Sinus infection, resolved:  Continue guaifenesin and nasal saline      p.r.n.  2. Boils on buttocks:  If this does occur again, it is most likely      secondary to folliculitis or acne on the buttocks.  I recommended      her to use warm compresses.  She may use Cetaphil to clean the      area.  She may also apply antibiotic ointment.  If there is an area      that does not seem to be draining or healing on its own she is to      come in for a possible incision and drainage.  3. Prevention:  She was given a flu vaccine today.  She is up-to-date      with tetanus.  She is up-to-date with her Pap smear but will be      scheduled in January for a repeat.  She is also overdue for a      mammogram  which we will schedule her for.  She has not had a      cholesterol panel done in quite some time and we will therefore      check this.  We did discuss colon cancer screening options.  She      has chosen hemoccults and was given hemoccult cards today.     Kerby Nora, MD  Electronically Signed    AB/MedQ  DD: 12/22/2005  DT: 12/23/2005  Job #: [id number]

## 2010-10-01 ENCOUNTER — Encounter: Payer: Self-pay | Admitting: Family Medicine

## 2010-10-03 ENCOUNTER — Ambulatory Visit: Payer: BC Managed Care – PPO | Admitting: Family Medicine

## 2010-10-04 ENCOUNTER — Encounter: Payer: Self-pay | Admitting: *Deleted

## 2010-10-04 ENCOUNTER — Encounter: Payer: Self-pay | Admitting: Family Medicine

## 2010-10-04 ENCOUNTER — Ambulatory Visit (INDEPENDENT_AMBULATORY_CARE_PROVIDER_SITE_OTHER): Payer: BC Managed Care – PPO | Admitting: Family Medicine

## 2010-10-04 VITALS — BP 110/70 | HR 53 | Temp 98.6°F | Ht 62.5 in | Wt 124.8 lb

## 2010-10-04 DIAGNOSIS — R1013 Epigastric pain: Secondary | ICD-10-CM

## 2010-10-04 LAB — COMPREHENSIVE METABOLIC PANEL
ALT: 14 U/L (ref 0–35)
Albumin: 4.1 g/dL (ref 3.5–5.2)
CO2: 29 mEq/L (ref 19–32)
Creatinine, Ser: 0.7 mg/dL (ref 0.4–1.2)
Potassium: 4.7 mEq/L (ref 3.5–5.1)

## 2010-10-04 LAB — CBC WITH DIFFERENTIAL/PLATELET
Eosinophils Absolute: 0.4 10*3/uL (ref 0.0–0.7)
Eosinophils Relative: 7.4 % — ABNORMAL HIGH (ref 0.0–5.0)
MCHC: 32.9 g/dL (ref 30.0–36.0)
MCV: 87 fl (ref 78.0–100.0)
Monocytes Absolute: 0.5 10*3/uL (ref 0.1–1.0)
Monocytes Relative: 10 % (ref 3.0–12.0)
Platelets: 182 10*3/uL (ref 150.0–400.0)
RDW: 14.1 % (ref 11.5–14.6)

## 2010-10-04 MED ORDER — OMEPRAZOLE 40 MG PO CPDR: 40.0000 mg | DELAYED_RELEASE_CAPSULE | Freq: Every day | ORAL | Status: DC

## 2010-10-04 NOTE — Progress Notes (Signed)
  Subjective:    Patient ID: [name] [name], female    DOB: 04-12-1955, 55 y.o.   MRN: [id number]  HPI  55 year old female presents with epigastric pain and irritation x several months, worse in last few weeks. Worse with sleep, wakes her up at night. Much worse when she lays down. Feel like she is starving stabbing pain, nausea,   Prilosec  20 mg no longer was helping.Marland Kitchen So changed to pepcid AC and pepto bismol.  Occ episodes of non bloody emesis now in last week.  No blood in stool... Stools are dark with pepto.\nOCc pain runs through to back   Eating small meals all day...feels better if she eats. Drinks 6 diet cokes a day, no alcohol use. Rare ibuprofen,  Has had some headaches at times. Occ facial swelling. Gets allergy shots.  Saw allergist...sinuses clear on CT sinus.  Has lost weight on weight watchers... About 15 lb weigh loss.   Review of Systems  Constitutional: Negative for fever and fatigue.  HENT: Negative for ear pain.   Eyes: Negative for pain.  Respiratory: Negative for chest tightness and shortness of breath.   Cardiovascular: Negative for chest pain, palpitations and leg swelling.  Gastrointestinal: Positive for nausea and abdominal pain. Negative for vomiting, diarrhea, constipation, blood in stool, abdominal distention and rectal pain.  Genitourinary: Negative for dysuria.       Objective:   Physical Exam  Constitutional: Vital signs are normal. She appears well-developed and well-nourished. She is cooperative.  Non-toxic appearance. She does not appear ill. No distress.  HENT:  Head: Normocephalic.  Right Ear: Hearing, tympanic membrane, external ear and ear canal normal. Tympanic membrane is not erythematous, not retracted and not bulging.  Left Ear: Hearing, tympanic membrane, external ear and ear canal normal. Tympanic membrane is not erythematous, not retracted and not bulging.  Nose: No mucosal edema or rhinorrhea. Right sinus exhibits no maxillary  sinus tenderness and no frontal sinus tenderness. Left sinus exhibits no maxillary sinus tenderness and no frontal sinus tenderness.  Mouth/Throat: Uvula is midline, oropharynx is clear and moist and mucous membranes are normal.  Eyes: Conjunctivae, EOM and lids are normal. Pupils are equal, round, and reactive to light. No foreign bodies found.  Neck: Trachea normal and normal range of motion. Neck supple. Carotid bruit is not present. No mass and no thyromegaly present.  Cardiovascular: Normal rate, regular rhythm, S1 normal, S2 normal, normal heart sounds, intact distal pulses and normal pulses.  Exam reveals no gallop and no friction rub.   No murmur heard. Pulmonary/Chest: Effort normal and breath sounds normal. Not tachypneic. No respiratory distress. She has no decreased breath sounds. She has no wheezes. She has no rhonchi. She has no rales.  Abdominal: Soft. Normal appearance and bowel sounds are normal. There is no hepatosplenomegaly. There is tenderness in the epigastric area. There is no rigidity, no rebound, no guarding, no CVA tenderness, no tenderness at McBurney's point and negative Murphy's sign.  Neurological: She is alert.  Skin: Skin is warm, dry and intact. No rash noted.  Psychiatric: Her speech is normal and behavior is normal. Judgment and thought content normal. Her mood appears not anxious. Cognition and memory are normal. She does not exhibit a depressed mood.          Assessment & Plan:

## 2010-10-04 NOTE — Patient Instructions (Signed)
Will call with lab results. Increase prilosec to 40 mg daily. Decrease/stop soda and caffeine. Decrease tomato, citris,spicy foods.

## 2010-10-04 NOTE — Assessment & Plan Note (Signed)
Concerning for PUD given improved with eating. HAs history of GERD and gastritis.  Will restart and increase prilosec to 40 mg daily. Avoid triggers, decrease caffeine, soda. Will eval LFTS and lipase. Check Hpylori.  If not improving will need referral for GI for likely endoscopy.

## 2010-10-05 LAB — HELICOBACTER PYLORI  SPECIAL ANTIGEN: H. PYLORI Antigen: NEGATIVE

## 2010-12-19 ENCOUNTER — Ambulatory Visit (INDEPENDENT_AMBULATORY_CARE_PROVIDER_SITE_OTHER): Payer: BC Managed Care – PPO | Admitting: Family Medicine

## 2010-12-19 ENCOUNTER — Encounter: Payer: Self-pay | Admitting: Family Medicine

## 2010-12-19 VITALS — BP 120/72 | HR 67 | Temp 98.5°F | Ht 62.5 in | Wt 130.1 lb

## 2010-12-19 DIAGNOSIS — R21 Rash and other nonspecific skin eruption: Secondary | ICD-10-CM | POA: Insufficient documentation

## 2010-12-19 MED ORDER — HYDRALAZINE HCL 25 MG PO TABS: 25.0000 mg | ORAL_TABLET | Freq: Every evening | ORAL | Status: DC | PRN

## 2010-12-19 NOTE — Assessment & Plan Note (Addendum)
Likely allergic response to environmental changes.. Possibly due to heat or pressure. Unusual though that it did not respond to prednisone course given by allergist. Allergy testing negative.  Will given hydralazine to help with itching at night. Avoid triggers.   Given some associated dizziness and fatigue, generalized ill feeling.. Will eval with labs including auto immune basic eval. (recent CMET, TSH, cbc nml in last 3-9 months)   Will refer to to [facility] for further eval/treat.

## 2010-12-19 NOTE — Progress Notes (Signed)
  Subjective:    Patient ID: [name] [name], female    DOB: [birth year], 55 y.o.   MRN: [id number]  Rash This is a recurrent (In past many years ago.. she has had intermittant rasj on arms.. never lasted this long) problem. The current episode started more than 1 month ago (she notes rash on arms after scratching a lot). The problem has been gradually worsening since onset. The affected locations include the right arm and left arm. Rash characteristics: small bumps, slightly red, no pustules, no vesicles She feels she has seen hives on arms after scratching. Associated with: Had neg allergy profile in other state to eval.She feels it may be worse when she is warm.Worsse at night or if something tigh on her arms. Associated symptoms include fatigue. Pertinent negatives include no congestion, cough, fever, joint pain or shortness of breath. (She feels face is puffy. She does feel if she is feeling poorly.. Like dizzyness. She has more itching) Past treatments include anti-itch cream and topical steroids (cold damp cloth helps, allergist tried prednisone taper in September did not help much.). Her past medical history is significant for allergies. There is no history of asthma or eczema.        Review of Systems  Constitutional: Positive for fatigue. Negative for fever.  HENT: Negative for congestion.   Respiratory: Negative for cough and shortness of breath.   Musculoskeletal: Negative for joint pain.  Skin: Positive for rash.       Objective:   Physical Exam  Constitutional: She appears well-developed and well-nourished.  HENT:  Head: Normocephalic and atraumatic.  Right Ear: External ear normal.  Left Ear: External ear normal.  Nose: Nose normal.  Mouth/Throat: Oropharynx is clear and moist.  Eyes: Conjunctivae are normal. Pupils are equal, round, and reactive to light.  Neck: Normal range of motion. Neck supple. No thyromegaly present.  Cardiovascular: Normal rate, regular rhythm  and intact distal pulses.  Exam reveals no gallop and no friction rub.   No murmur heard. Pulmonary/Chest: Effort normal and breath sounds normal. No respiratory distress. She has no wheezes. She has no rales. She exhibits no tenderness.  Abdominal: Soft. Bowel sounds are normal.  Skin: Skin is warm and dry. Rash noted.       Small erythematous nodules B upper arms in areas pt can reach.. No other rash          Assessment & Plan:

## 2010-12-19 NOTE — Patient Instructions (Signed)
Use hydralazine at bedtime for itching to help with sleep.  Stop at front desk to schedule appt with Derm.

## 2011-02-05 ENCOUNTER — Other Ambulatory Visit: Payer: Self-pay | Admitting: Family Medicine

## 2011-02-07 ENCOUNTER — Other Ambulatory Visit: Payer: Self-pay | Admitting: *Deleted

## 2011-02-07 MED ORDER — OMEPRAZOLE 40 MG PO CPDR: 40.0000 mg | DELAYED_RELEASE_CAPSULE | Freq: Every day | ORAL | Status: DC

## 2011-06-11 ENCOUNTER — Other Ambulatory Visit: Payer: Self-pay | Admitting: Family Medicine

## 2011-10-03 ENCOUNTER — Ambulatory Visit (INDEPENDENT_AMBULATORY_CARE_PROVIDER_SITE_OTHER): Payer: BC Managed Care – PPO | Admitting: Family Medicine

## 2011-10-03 ENCOUNTER — Encounter: Payer: Self-pay | Admitting: Family Medicine

## 2011-10-03 VITALS — BP 119/74 | HR 60 | Temp 98.5°F | Ht 62.0 in | Wt 131.5 lb

## 2011-10-03 DIAGNOSIS — R5383 Other fatigue: Secondary | ICD-10-CM

## 2011-10-03 DIAGNOSIS — R5381 Other malaise: Secondary | ICD-10-CM

## 2011-10-03 DIAGNOSIS — G43709 Chronic migraine without aura, not intractable, without status migrainosus: Secondary | ICD-10-CM | POA: Insufficient documentation

## 2011-10-03 DIAGNOSIS — H5789 Other specified disorders of eye and adnexa: Secondary | ICD-10-CM

## 2011-10-03 DIAGNOSIS — R252 Cramp and spasm: Secondary | ICD-10-CM

## 2011-10-03 DIAGNOSIS — Z8279 Family history of other congenital malformations, deformations and chromosomal abnormalities: Secondary | ICD-10-CM | POA: Insufficient documentation

## 2011-10-03 NOTE — Assessment & Plan Note (Signed)
Per neuro 

## 2011-10-03 NOTE — Progress Notes (Signed)
  Subjective:    Patient ID: [name] [name], female    DOB: 08-21-1955, 56 y.o.   MRN: [id number]  HPI   56 year old female with history of ? migraines presents with headaches and puffiness under eyes ongoing for > 9 months. Headaches 3-4 times a month.  Seeing allergist... No sinus infection. No allergies. Head CT negative. Sent to neurologist, Dr. Clelia Croft... He diagnosed classic migraines last week. Given imitrex and phenergan for acute headaches, she refused preventative med, recommended magnesium Nml 6 month eye exam, no sign of glaucoma. Blood tests : vit D def... On supplement.  She feels that her eyes are swollen constantly. Not associated.    Has also noted leg cramps at night... could be SE to omeprazole.  She gets fatigued and excess SOB with exercise. No chest pain.  occ swelling in ankles She is concerned because her sister has been diagnosed with congenital aortic valve issue needing replacement recently.  Normal ANA, sed rate, thyroid. 12/2010.      Review of Systems  Constitutional: Negative for fever and fatigue.  HENT: Negative for ear pain.   Eyes: Negative for pain.  Respiratory: Positive for shortness of breath. Negative for chest tightness.   Cardiovascular: Positive for leg swelling. Negative for chest pain and palpitations.  Gastrointestinal: Negative for abdominal pain and abdominal distention.  Genitourinary: Negative for dysuria.       Objective:   Physical Exam  Constitutional: Vital signs are normal. She appears well-developed and well-nourished. She is cooperative.  Non-toxic appearance. She does not appear ill. No distress.  HENT:  Head: Normocephalic.  Right Ear: Hearing, tympanic membrane, external ear and ear canal normal. Tympanic membrane is not erythematous, not retracted and not bulging.  Left Ear: Hearing, tympanic membrane, external ear and ear canal normal. Tympanic membrane is not erythematous, not retracted and not bulging.  Nose:  No mucosal edema or rhinorrhea. Right sinus exhibits no maxillary sinus tenderness and no frontal sinus tenderness. Left sinus exhibits no maxillary sinus tenderness and no frontal sinus tenderness.  Mouth/Throat: Uvula is midline, oropharynx is clear and moist and mucous membranes are normal.  Eyes: Conjunctivae normal, EOM and lids are normal. Pupils are equal, round, and reactive to light. No foreign bodies found.       Discoloration/ darkening and swelling under eyes, drooping of B lids, left greater than right. No erythema, full EOMI.  Neck: Trachea normal and normal range of motion. Neck supple. Carotid bruit is not present. No mass and no thyromegaly present.  Cardiovascular: Normal rate, regular rhythm, S1 normal, S2 normal, normal heart sounds, intact distal pulses and normal pulses.  Exam reveals no gallop and no friction rub.   No murmur heard. Pulmonary/Chest: Effort normal and breath sounds normal. Not tachypneic. No respiratory distress. She has no decreased breath sounds. She has no wheezes. She has no rhonchi. She has no rales.  Abdominal: Soft. Normal appearance and bowel sounds are normal. There is no tenderness.  Neurological: She is alert.  Skin: Skin is warm, dry and intact. No rash noted.  Psychiatric: Her speech is normal and behavior is normal. Judgment and thought content normal. Her mood appears not anxious. Cognition and memory are normal. She does not exhibit a depressed mood.          Assessment & Plan:

## 2011-10-03 NOTE — Assessment & Plan Note (Signed)
Try to wean off PPI as may be causing SE.

## 2011-10-03 NOTE — Assessment & Plan Note (Signed)
Given sisters new diagnosis and pt reportting occ DOE, fatigue and swelling feet when warm.. Will check ECHO.

## 2011-10-03 NOTE — Assessment & Plan Note (Signed)
Has had nml sed rate, ANA and tsh. nml eye exam. No current sinus infection, allergies well controlled . No headache associated every time per pt.  On exam appear most consistent with aging changes under eyes and drooping of lids  Left greater than right.

## 2011-10-03 NOTE — Patient Instructions (Addendum)
Stop at front desk to set up [facility] [place]. Try to wean off omeprazole.. Start by decreasing to 20 mg daily, then every other day, then every 2 days.Marland Kitchenthen stop if no symptoms return. May be causing leg cramps. Schedule CPX in 2-4 week, with fasting labs prior.

## 2011-10-21 ENCOUNTER — Other Ambulatory Visit: Payer: Self-pay

## 2011-10-21 ENCOUNTER — Other Ambulatory Visit (INDEPENDENT_AMBULATORY_CARE_PROVIDER_SITE_OTHER): Payer: BC Managed Care – PPO

## 2011-10-21 DIAGNOSIS — I359 Nonrheumatic aortic valve disorder, unspecified: Secondary | ICD-10-CM

## 2011-10-21 DIAGNOSIS — R5383 Other fatigue: Secondary | ICD-10-CM

## 2011-10-21 DIAGNOSIS — Z8279 Family history of other congenital malformations, deformations and chromosomal abnormalities: Secondary | ICD-10-CM

## 2011-12-09 ENCOUNTER — Telehealth: Payer: Self-pay | Admitting: Family Medicine

## 2011-12-09 DIAGNOSIS — Z13220 Encounter for screening for lipoid disorders: Secondary | ICD-10-CM

## 2011-12-09 NOTE — Telephone Encounter (Signed)
Message copied by Excell Seltzer on Tue Dec 09, 2011  4:14 PM ------      Message from: Alvina Chou      Created: Mon Dec 08, 2011  4:43 PM      Regarding: Lab orders for Thursday, 12.5.13       Patient is scheduled for CPX labs, please order future labs, Thanks , Camelia Eng

## 2011-12-11 ENCOUNTER — Other Ambulatory Visit (INDEPENDENT_AMBULATORY_CARE_PROVIDER_SITE_OTHER): Payer: BC Managed Care – PPO

## 2011-12-11 ENCOUNTER — Encounter: Payer: BC Managed Care – PPO | Admitting: Family Medicine

## 2011-12-11 DIAGNOSIS — Z13220 Encounter for screening for lipoid disorders: Secondary | ICD-10-CM

## 2011-12-11 LAB — COMPREHENSIVE METABOLIC PANEL
Albumin: 4.2 g/dL (ref 3.5–5.2)
Alkaline Phosphatase: 65 U/L (ref 39–117)
BUN: 9 mg/dL (ref 6–23)
Glucose, Bld: 92 mg/dL (ref 70–99)
Potassium: 4.3 mEq/L (ref 3.5–5.1)

## 2011-12-11 LAB — LIPID PANEL
Cholesterol: 184 mg/dL (ref 0–200)
LDL Cholesterol: 94 mg/dL (ref 0–99)
Triglycerides: 166 mg/dL — ABNORMAL HIGH (ref 0.0–149.0)

## 2011-12-19 ENCOUNTER — Ambulatory Visit (INDEPENDENT_AMBULATORY_CARE_PROVIDER_SITE_OTHER): Payer: BC Managed Care – PPO | Admitting: Family Medicine

## 2011-12-19 ENCOUNTER — Other Ambulatory Visit (HOSPITAL_COMMUNITY)
Admission: RE | Admit: 2011-12-19 | Discharge: 2011-12-19 | Disposition: A | Discharge status: Home / Self Care | Payer: BC Managed Care – PPO | Source: Ambulatory Visit | Attending: Family Medicine | Admitting: Family Medicine

## 2011-12-19 ENCOUNTER — Encounter: Payer: Self-pay | Admitting: Family Medicine

## 2011-12-19 VITALS — BP 110/70 | HR 65 | Temp 97.8°F | Ht 62.0 in | Wt 131.5 lb

## 2011-12-19 DIAGNOSIS — Z1231 Encounter for screening mammogram for malignant neoplasm of breast: Secondary | ICD-10-CM

## 2011-12-19 DIAGNOSIS — I519 Heart disease, unspecified: Secondary | ICD-10-CM

## 2011-12-19 DIAGNOSIS — Z01419 Encounter for gynecological examination (general) (routine) without abnormal findings: Secondary | ICD-10-CM

## 2011-12-19 DIAGNOSIS — Z1151 Encounter for screening for human papillomavirus (HPV): Secondary | ICD-10-CM | POA: Insufficient documentation

## 2011-12-19 DIAGNOSIS — Z0000 Encounter for general adult medical examination without abnormal findings: Secondary | ICD-10-CM

## 2011-12-19 NOTE — Patient Instructions (Addendum)
Stop at front desk to schedule mammogram.  Work on low fat diet.  Follow up in 1 year for CPX with labs prior

## 2011-12-19 NOTE — Progress Notes (Signed)
Subjective:    Patient ID: [name] [name], female    DOB: 1955-08-15, 56 y.o.   MRN: [id number]  HPI The patient is here for annual wellness exam and preventative care.    Lab Results  Component Value Date   CHOL 184 12/11/2011   HDL 56.40 12/11/2011   LDLCALC 94 12/11/2011   TRIG 166.0* 12/11/2011   CHOLHDL 3 12/11/2011   Triglycerides mildly elevate.  All labs reviewed in detail with pt. No DM, nml CMET.   She is doing well off GERD medication. No heartburn issues.  No nausea, no abdominal pain.  Currently being treated for acute sinusitis.      Review of Systems  Constitutional: Negative for fever and fatigue.  HENT: Negative for ear pain.   Eyes: Negative for pain.  Respiratory: Negative for chest tightness and shortness of breath.   Cardiovascular: Negative for chest pain, palpitations and leg swelling.  Gastrointestinal: Negative for abdominal pain.  Genitourinary: Negative for dysuria.       Objective:   Physical Exam  Constitutional: Vital signs are normal. She appears well-developed and well-nourished. She is cooperative.  Non-toxic appearance. She does not appear ill. No distress.  HENT:  Head: Normocephalic.  Right Ear: Hearing, tympanic membrane, external ear and ear canal normal.  Left Ear: Hearing, tympanic membrane, external ear and ear canal normal.  Nose: Nose normal.  Eyes: Conjunctivae normal, EOM and lids are normal. Pupils are equal, round, and reactive to light. No foreign bodies found.  Neck: Trachea normal and normal range of motion. Neck supple. Carotid bruit is not present. No mass and no thyromegaly present.  Cardiovascular: Normal rate, regular rhythm, S1 normal, S2 normal, normal heart sounds and intact distal pulses.  Exam reveals no gallop.   No murmur heard. Pulmonary/Chest: Effort normal and breath sounds normal. No respiratory distress. She has no wheezes. She has no rhonchi. She has no rales.  Abdominal: Soft. Normal appearance and  bowel sounds are normal. She exhibits no distension, no fluid wave, no abdominal bruit and no mass. There is no hepatosplenomegaly. There is no tenderness. There is no rebound, no guarding and no CVA tenderness. No hernia.  Genitourinary: Vagina normal and uterus normal. No breast swelling, tenderness, discharge or bleeding. Pelvic exam was performed with patient prone. There is no rash, tenderness or lesion on the right labia. There is no rash, tenderness or lesion on the left labia. Uterus is not enlarged and not tender. Cervix exhibits no motion tenderness, no discharge and no friability. Right adnexum displays no mass, no tenderness and no fullness. Left adnexum displays no mass, no tenderness and no fullness.  Lymphadenopathy:    She has no cervical adenopathy.    She has no axillary adenopathy.  Neurological: She is alert. She has normal strength. No cranial nerve deficit or sensory deficit.  Skin: Skin is warm, dry and intact. No rash noted.  Psychiatric: Her speech is normal and behavior is normal. Judgment normal. Her mood appears not anxious. Cognition and memory are normal. She does not exhibit a depressed mood.          Assessment & Plan:  The patient's preventative maintenance and recommended screening tests for an annual wellness exam were reviewed in full today. Brought up to date unless services declined.  Counselled on the importance of diet, exercise, and its role in overall health and mortality. The patient's FH and SH was reviewed, including their home life, tobacco status, and drug and alcohol status.  Vaccines: uptodate with Tdap, flu refused  Mammo:last nml 2011  PAP/DVE: last pap nml 2011, on every 2 year schedule pap, dve yearly Colon: 03/2009 small polyp , repeat in 10 years. Nonsmoker

## 2011-12-25 ENCOUNTER — Encounter: Payer: Self-pay | Admitting: *Deleted

## 2011-12-26 ENCOUNTER — Ambulatory Visit: Payer: BC Managed Care – PPO

## 2012-01-23 ENCOUNTER — Ambulatory Visit: Payer: BC Managed Care – PPO

## 2012-02-10 ENCOUNTER — Ambulatory Visit (INDEPENDENT_AMBULATORY_CARE_PROVIDER_SITE_OTHER): Payer: BC Managed Care – PPO | Admitting: Family Medicine

## 2012-02-10 ENCOUNTER — Encounter: Payer: Self-pay | Admitting: Family Medicine

## 2012-02-10 VITALS — BP 120/74 | HR 63 | Temp 98.5°F | Ht 62.0 in | Wt 134.5 lb

## 2012-02-10 DIAGNOSIS — R197 Diarrhea, unspecified: Secondary | ICD-10-CM

## 2012-02-10 NOTE — Assessment & Plan Note (Signed)
No clear sign of infection. She did have recent antibiotics...will eval with Cdiff as well as stool culture. Start probiotic.

## 2012-02-10 NOTE — Progress Notes (Signed)
  Subjective:    Patient ID: [name] [name], female    DOB: [birth year], 57 y.o.   MRN: [id number]  HPI  57 year old female  With diarrhea in last 5-6 weeks. Loose stool when  she goes to the bathroom. Having 2 BMs a day.   Rare abdominal pain only if eating poorly... In epigastrum. After super bowel.  No lower down abdominal pain. No blood in stool. She does not have any urgency.  She is gaining weight, feels hungry all the time.  She has noted since stopping omeprazole she has had softer stool. No new meds. Last course on antibiotics in 12/2011. No increase in stres lately. No recent travel except [place]. No sick contacts.  Review of Systems  Constitutional: Negative for fever and fatigue.  HENT: Negative for ear pain.   Eyes: Negative for pain.  Respiratory: Negative for chest tightness and shortness of breath.   Cardiovascular: Negative for chest pain, palpitations and leg swelling.  Gastrointestinal: Negative for abdominal pain.  Genitourinary: Negative for dysuria.       Objective:   Physical Exam  Constitutional: Vital signs are normal. She appears well-developed and well-nourished. She is cooperative.  Non-toxic appearance. She does not appear ill. No distress.  HENT:  Head: Normocephalic.  Right Ear: Hearing, tympanic membrane, external ear and ear canal normal. Tympanic membrane is not erythematous, not retracted and not bulging.  Left Ear: Hearing, tympanic membrane, external ear and ear canal normal. Tympanic membrane is not erythematous, not retracted and not bulging.  Nose: No mucosal edema or rhinorrhea. Right sinus exhibits no maxillary sinus tenderness and no frontal sinus tenderness. Left sinus exhibits no maxillary sinus tenderness and no frontal sinus tenderness.  Mouth/Throat: Uvula is midline, oropharynx is clear and moist and mucous membranes are normal.  Eyes: Conjunctivae normal, EOM and lids are normal. Pupils are equal, round, and reactive to  light. No foreign bodies found.  Neck: Trachea normal and normal range of motion. Neck supple. Carotid bruit is not present. No mass and no thyromegaly present.  Cardiovascular: Normal rate, regular rhythm, S1 normal, S2 normal, normal heart sounds, intact distal pulses and normal pulses.  Exam reveals no gallop and no friction rub.   No murmur heard. Pulmonary/Chest: Effort normal and breath sounds normal. Not tachypneic. No respiratory distress. She has no decreased breath sounds. She has no wheezes. She has no rhonchi. She has no rales.  Abdominal: Soft. Normal appearance and bowel sounds are normal. There is no tenderness.  Neurological: She is alert.  Skin: Skin is warm, dry and intact. No rash noted.  Psychiatric: Her speech is normal and behavior is normal. Judgment and thought content normal. Her mood appears not anxious. Cognition and memory are normal. She does not exhibit a depressed mood.          Assessment & Plan:

## 2012-02-10 NOTE — Patient Instructions (Addendum)
Push fluids. Make sure to return to normal healthy diet. We will call with stool evaluation. Start probiotic like align or other lactobaccilli.

## 2012-02-14 LAB — CLOSTRIDIUM DIFFICILE EIA: CDIFTX: NEGATIVE

## 2012-02-15 LAB — STOOL CULTURE

## 2012-02-24 ENCOUNTER — Encounter: Payer: Self-pay | Admitting: Family Medicine

## 2012-02-24 ENCOUNTER — Ambulatory Visit (INDEPENDENT_AMBULATORY_CARE_PROVIDER_SITE_OTHER): Payer: BC Managed Care – PPO | Admitting: Family Medicine

## 2012-02-24 ENCOUNTER — Encounter: Payer: Self-pay | Admitting: Internal Medicine

## 2012-02-24 VITALS — BP 110/72 | HR 61 | Temp 98.2°F | Ht 62.0 in | Wt 131.0 lb

## 2012-02-24 DIAGNOSIS — K529 Noninfective gastroenteritis and colitis, unspecified: Secondary | ICD-10-CM

## 2012-02-24 DIAGNOSIS — R197 Diarrhea, unspecified: Secondary | ICD-10-CM

## 2012-02-24 DIAGNOSIS — J329 Chronic sinusitis, unspecified: Secondary | ICD-10-CM

## 2012-02-24 MED ORDER — CLARITHROMYCIN 500 MG PO TABS: 500.0000 mg | ORAL_TABLET | Freq: Two times a day (BID) | ORAL | Status: DC

## 2012-02-24 NOTE — Patient Instructions (Addendum)
Stop at front desk to set up GI referral. Use mucinex  DM or delsym for cough. Using nasal saline irrigation 2-3 times a day. Start and complete antibitoics. Call if not improving as expected.  Okay to have her set up a family member to reestablish: [name] [name]

## 2012-02-24 NOTE — Assessment & Plan Note (Signed)
Labs and culture neg. Possibly IBS, but will refer to GI for further eval to r/o other disorders.

## 2012-02-24 NOTE — Assessment & Plan Note (Signed)
Treat with antibitoic:  Clarithromycin x 10 days given PCN allergy.  Mucolytic and nasal saline irrigation.

## 2012-02-24 NOTE — Progress Notes (Signed)
  Subjective:    Patient ID: [name] [name], female    DOB: May 12, 1955, 57 y.o.   MRN: [id number]  Cough This is a new problem. The current episode started 1 to 4 weeks ago. The cough is productive of sputum. Associated symptoms include nasal congestion, postnasal drip and rhinorrhea. Pertinent negatives include no shortness of breath or sweats. Associated symptoms comments: B sinus pressure. The symptoms are aggravated by lying down. Risk factors: nonsmoker. Treatments tried: using delsym. Her past medical history is significant for environmental allergies.   She has not been using claritin.  She reports also continued diarrhea since December. She reports lose stools are more frequent. Stool eval negative. Probiotic has noted helped.   Review of Systems  HENT: Positive for rhinorrhea and postnasal drip.   Respiratory: Negative for shortness of breath.   Gastrointestinal: Negative for abdominal pain and abdominal distention.  Allergic/Immunologic: Positive for environmental allergies.       Objective:   Physical Exam  Constitutional: Vital signs are normal. She appears well-developed and well-nourished. She is cooperative.  Non-toxic appearance. She does not appear ill. No distress.  HENT:  Head: Normocephalic.  Right Ear: Hearing, tympanic membrane, external ear and ear canal normal. Tympanic membrane is not erythematous, not retracted and not bulging.  Left Ear: Hearing, tympanic membrane, external ear and ear canal normal. Tympanic membrane is not erythematous, not retracted and not bulging.  Nose: Mucosal edema and rhinorrhea present. Right sinus exhibits no maxillary sinus tenderness and no frontal sinus tenderness. Left sinus exhibits no maxillary sinus tenderness and no frontal sinus tenderness.  Mouth/Throat: Uvula is midline, oropharynx is clear and moist and mucous membranes are normal.  Eyes: Conjunctivae, EOM and lids are normal. Pupils are equal, round, and reactive to  light. No foreign bodies found.  Neck: Trachea normal and normal range of motion. Neck supple. Carotid bruit is not present. No mass and no thyromegaly present.  Cardiovascular: Normal rate, regular rhythm, S1 normal, S2 normal, normal heart sounds, intact distal pulses and normal pulses.  Exam reveals no gallop and no friction rub.   No murmur heard. Pulmonary/Chest: Effort normal and breath sounds normal. Not tachypneic. No respiratory distress. She has no decreased breath sounds. She has no wheezes. She has no rhonchi. She has no rales.  Abdominal: Soft. Normal appearance and bowel sounds are normal. There is no hepatomegaly. There is no tenderness.  Neurological: She is alert.  Skin: Skin is warm, dry and intact. No rash noted.  Psychiatric: Her speech is normal and behavior is normal. Judgment normal. Her mood appears not anxious. Cognition and memory are normal. She does not exhibit a depressed mood.          Assessment & Plan:

## 2012-03-09 ENCOUNTER — Encounter: Payer: Self-pay | Admitting: *Deleted

## 2012-04-01 ENCOUNTER — Other Ambulatory Visit: Payer: Self-pay | Admitting: Dermatology

## 2012-04-09 ENCOUNTER — Ambulatory Visit
Admission: RE | Admit: 2012-04-09 | Discharge: 2012-04-09 | Disposition: A | Discharge status: Home / Self Care | Payer: BC Managed Care – PPO | Source: Ambulatory Visit | Attending: Family Medicine | Admitting: Family Medicine

## 2012-04-09 DIAGNOSIS — Z1231 Encounter for screening mammogram for malignant neoplasm of breast: Secondary | ICD-10-CM

## 2012-04-20 ENCOUNTER — Ambulatory Visit (INDEPENDENT_AMBULATORY_CARE_PROVIDER_SITE_OTHER): Payer: BC Managed Care – PPO | Admitting: Internal Medicine

## 2012-04-20 ENCOUNTER — Encounter: Payer: Self-pay | Admitting: Internal Medicine

## 2012-04-20 VITALS — BP 110/72 | HR 76 | Ht 62.0 in | Wt 130.6 lb

## 2012-04-20 DIAGNOSIS — R198 Other specified symptoms and signs involving the digestive system and abdomen: Secondary | ICD-10-CM

## 2012-04-20 NOTE — Patient Instructions (Addendum)
Follow up with Dr Juanda Chance as needed. CC: Dr Kerby Nora

## 2012-04-20 NOTE — Progress Notes (Signed)
[name] [name] 06-Apr-1955 MRN [id number]  History of Present Illness:  This is a 57 year old white female with a change in bowel habits of 6 months duration. 6 months ago, she was started on magnesium, 2 tablets daily for migraine headaches, not realizing it may act as a laxative. She denies rectal bleeding or abdominal pain. Her bowel habits consist of 2 loose bowel movements in the mornings. She has not had a formed stool for the past several months. There is no nocturnal diarrhea, weight loss, nausea or vomiting. A screening colonoscopy in March 2011 showed a diminutive polyp. Biopsies showed polypoid mucosa.   Past Medical History  Diagnosis Date  . Allergy   . Alcoholism   . Headache    Past Surgical History  Procedure Laterality Date  . Nasal septum surgery    . Tonsillectomy    . Colposcopy      reports that she has never smoked. She has never used smokeless tobacco. She reports that she does not drink alcohol or use illicit drugs. family history includes Heart attack in her father and Lung cancer in her mother. Allergies  Allergen Reactions  . Penicillins     REACTION: Rash        Review of Systems:  The remainder of the 10 point ROS is negative except as outlined in H&P   Physical Exam: General appearance  Well developed, in no distress. Eyes- non icteric. HEENT nontraumatic, normocephalic. Mouth no lesions, tongue papillated, no cheilosis. Neck supple without adenopathy, thyroid not enlarged, no carotid bruits, no JVD. Lungs Clear to auscultation bilaterally. Cor normal S1, normal S2, regular rhythm, no murmur,  quiet precordium. Abdomen: Soft nontender abdomen with normal active bowel sounds. No distention. Minimal tenderness in left lower quadrant. No tympany. Liver edge at costal margin. Rectal: Normal perianal area. Normal rectal sphincter tone. Stool is soft Hemoccult negative. Extremities no pedal edema. Skin no lesions. Neurological alert and  oriented x 3. Psychological normal mood and affect.  Assessment and Plan:  Problem #69 57 year old white female with change in bowel habits towards diarrhea. She started on magnesium supplements at the time her diarrhea started. I think the magnesium is causing the change in her bowel habits. The other possibilities include bacterial overgrowth or possibly irritable bowel syndrome. I have asked her to discontinue magnesium for one week to see if her bowel habits normalize and if not, we will consider BO and will start  a probiotic  to normalize her bacterial flora. If the diarrhea continues, then I would also consider further evaluation including a sprue profile, stool cultures, C difficile, sedimentation rate and other test groups for causes of her diarrhea. And possible flexible sigmoidoscopy to r/o microscopic colitis   04/20/2012 Lina Sar

## 2012-11-11 ENCOUNTER — Other Ambulatory Visit: Payer: Self-pay

## 2013-02-03 ENCOUNTER — Other Ambulatory Visit (INDEPENDENT_AMBULATORY_CARE_PROVIDER_SITE_OTHER): Payer: BC Managed Care – PPO

## 2013-02-03 ENCOUNTER — Telehealth: Payer: Self-pay | Admitting: Family Medicine

## 2013-02-03 DIAGNOSIS — Z13220 Encounter for screening for lipoid disorders: Secondary | ICD-10-CM

## 2013-02-03 LAB — LIPID PANEL
Cholesterol: 218 mg/dL — ABNORMAL HIGH (ref 0–200)
HDL: 65.7 mg/dL (ref >39.00)
Total CHOL/HDL Ratio: 3
Triglycerides: 136 mg/dL (ref 0.0–149.0)
VLDL: 27.2 mg/dL (ref 0.0–40.0)

## 2013-02-03 LAB — COMPREHENSIVE METABOLIC PANEL
ALT: 30 U/L (ref 0–35)
AST: 30 U/L (ref 0–37)
Albumin: 4.2 g/dL (ref 3.5–5.2)
Alkaline Phosphatase: 65 U/L (ref 39–117)
BILIRUBIN TOTAL: 0.7 mg/dL (ref 0.3–1.2)
BUN: 10 mg/dL (ref 6–23)
CO2: 30 meq/L (ref 19–32)
CREATININE: 0.8 mg/dL (ref 0.4–1.2)
Calcium: 9.7 mg/dL (ref 8.4–10.5)
Chloride: 105 mEq/L (ref 96–112)
GFR: 77.18 mL/min (ref >60.00)
GLUCOSE: 78 mg/dL (ref 70–99)
Potassium: 4.3 mEq/L (ref 3.5–5.1)
Sodium: 140 mEq/L (ref 135–145)
Total Protein: 6.9 g/dL (ref 6.0–8.3)

## 2013-02-03 LAB — LDL CHOLESTEROL, DIRECT: Direct LDL: 121.5 mg/dL

## 2013-02-03 NOTE — Telephone Encounter (Signed)
Message copied by Excell SeltzerBEDSOLE, Mckenzi Buonomo E on Thu Feb 03, 2013  8:28 AM ------      Message from: Alvina ChouWALSH, TERRI J      Created: Mon Jan 24, 2013  3:09 PM      Regarding: Lab orders for Thursday, 1.29.15       Patient is scheduled for CPX labs, please order future labs, Thanks , Terri       ------

## 2013-02-10 ENCOUNTER — Encounter: Payer: Self-pay | Admitting: Family Medicine

## 2013-02-10 ENCOUNTER — Ambulatory Visit (INDEPENDENT_AMBULATORY_CARE_PROVIDER_SITE_OTHER): Payer: BC Managed Care – PPO | Admitting: Family Medicine

## 2013-02-10 VITALS — BP 110/72 | HR 68 | Temp 97.9°F | Ht 61.5 in | Wt 133.2 lb

## 2013-02-10 DIAGNOSIS — Z0000 Encounter for general adult medical examination without abnormal findings: Secondary | ICD-10-CM

## 2013-02-10 MED ORDER — SUMATRIPTAN SUCCINATE 100 MG PO TABS: 100.0000 mg | ORAL_TABLET | ORAL | Status: DC | PRN

## 2013-02-10 NOTE — Patient Instructions (Addendum)
Schedule mammogram on your own in 04/2013  Schedule CPX in 1 year with labs prior.  Keep work ing on [employer]healthy eating and exercise!   Fat and Cholesterol Control Diet Fat and cholesterol levels in your blood and organs are influenced by your diet. High levels of fat and cholesterol may lead to diseases of the heart, small and large blood vessels, gallbladder, liver, and pancreas. CONTROLLING FAT AND CHOLESTEROL WITH DIET Although exercise and lifestyle factors are important, your diet is key. That is because certain foods are known to raise cholesterol and others to lower it. The goal is to balance foods for their effect on cholesterol and more importantly, to replace saturated and trans fat with other types of fat, such as monounsaturated fat, polyunsaturated fat, and omega-3 fatty acids. On average, a person should consume no more than 15 to 17 g of saturated fat daily. Saturated and trans fats are considered "bad" fats, and they will raise LDL cholesterol. Saturated fats are primarily found in animal products such as meats, butter, and cream. However, that does not mean you need to give up all your favorite foods. Today, there are good tasting, low-fat, low-cholesterol substitutes for most of the things you like to eat. Choose low-fat or nonfat alternatives. Choose round or loin cuts of red meat. These types of cuts are lowest in fat and cholesterol. Chicken (without the skin), fish, veal, and ground [place] breast are great choices. Eliminate fatty meats, such as hot dogs and salami. Even shellfish have little or no saturated fat. Have a 3 oz (85 g) portion when you eat lean meat, poultry, or fish. Trans fats are also called "partially hydrogenated oils." They are oils that have been scientifically manipulated so that they are solid at room temperature resulting in a longer shelf life and improved taste and texture of foods in which they are added. Trans fats are found in stick margarine, some tub  margarines, cookies, crackers, and baked goods.  When baking and cooking, oils are a great substitute for butter. The monounsaturated oils are especially beneficial since it is believed they lower LDL and raise HDL. The oils you should avoid entirely are saturated tropical oils, such as coconut and palm.  Remember to eat a lot from food groups that are naturally free of saturated and trans fat, including fish, fruit, vegetables, beans, grains (barley, rice, couscous, bulgur wheat), and pasta (without cream sauces).  IDENTIFYING FOODS THAT LOWER FAT AND CHOLESTEROL  Soluble fiber may lower your cholesterol. This type of fiber is found in fruits such as apples, vegetables such as broccoli, potatoes, and carrots, legumes such as beans, peas, and lentils, and grains such as barley. Foods fortified with plant sterols (phytosterol) may also lower cholesterol. You should eat at least 2 g per day of these foods for a cholesterol lowering effect.  Read package labels to identify low-saturated fats, trans fat free, and low-fat foods at the supermarket. Select cheeses that have only 2 to 3 g saturated fat per ounce. Use a heart-healthy tub margarine that is free of trans fats or partially hydrogenated oil. When buying baked goods (cookies, crackers), avoid partially hydrogenated oils. Breads and muffins should be made from whole grains (whole-wheat or whole oat flour, instead of "flour" or "enriched flour"). Buy non-creamy canned soups with reduced salt and no added fats.  FOOD PREPARATION TECHNIQUES  Never deep-fry. If you must fry, either stir-fry, which uses very little fat, or use non-stick cooking sprays. When possible, broil, bake, or  roast meats, and steam vegetables. Instead of putting butter or margarine on vegetables, use lemon and herbs, applesauce, and cinnamon (for squash and sweet potatoes). Use nonfat yogurt, salsa, and low-fat dressings for salads.  LOW-SATURATED FAT / LOW-FAT FOOD SUBSTITUTES Meats /  Saturated Fat (g)  Avoid: Steak, marbled (3 oz/85 g) / 11 g  Choose: Steak, lean (3 oz/85 g) / 4 g  Avoid: Hamburger (3 oz/85 g) / 7 g  Choose: Hamburger, lean (3 oz/85 g) / 5 g  Avoid: Ham (3 oz/85 g) / 6 g  Choose: Ham, lean cut (3 oz/85 g) / 2.4 g  Avoid: Chicken, with skin, dark meat (3 oz/85 g) / 4 g  Choose: Chicken, skin removed, dark meat (3 oz/85 g) / 2 g  Avoid: Chicken, with skin, light meat (3 oz/85 g) / 2.5 g  Choose: Chicken, skin removed, light meat (3 oz/85 g) / 1 g Dairy / Saturated Fat (g)  Avoid: Whole milk (1 cup) / 5 g  Choose: Low-fat milk, 2% (1 cup) / 3 g  Choose: Low-fat milk, 1% (1 cup) / 1.5 g  Choose: Skim milk (1 cup) / 0.3 g  Avoid: Hard cheese (1 oz/28 g) / 6 g  Choose: Skim milk cheese (1 oz/28 g) / 2 to 3 g  Avoid: Cottage cheese, 4% fat (1 cup) / 6.5 g  Choose: Low-fat cottage cheese, 1% fat (1 cup) / 1.5 g  Avoid: Ice cream (1 cup) / 9 g  Choose: Sherbet (1 cup) / 2.5 g  Choose: Nonfat frozen yogurt (1 cup) / 0.3 g  Choose: Frozen fruit bar / trace  Avoid: Whipped cream (1 tbs) / 3.5 g  Choose: Nondairy whipped topping (1 tbs) / 1 g Condiments / Saturated Fat (g)  Avoid: Mayonnaise (1 tbs) / 2 g  Choose: Low-fat mayonnaise (1 tbs) / 1 g  Avoid: Butter (1 tbs) / 7 g  Choose: Extra light margarine (1 tbs) / 1 g  Avoid: Coconut oil (1 tbs) / 11.8 g  Choose: Olive oil (1 tbs) / 1.8 g  Choose: Corn oil (1 tbs) / 1.7 g  Choose: Safflower oil (1 tbs) / 1.2 g  Choose: Sunflower oil (1 tbs) / 1.4 g  Choose: Soybean oil (1 tbs) / 2.4 g  Choose: Canola oil (1 tbs) / 1 g Document Released: 12/23/2004 Document Revised: 04/19/2012 Document Reviewed: 06/13/2010 [employer] Patient Information 2014 [place], [place].

## 2013-02-10 NOTE — Progress Notes (Signed)
Pre-visit discussion using our clinic review tool. No additional management support is needed unless otherwise documented below in the visit note.  

## 2013-02-10 NOTE — Progress Notes (Signed)
HPI  The patient is here for annual wellness exam and preventative care.   Doing well overall. No specific concerns.  LDL at goal <130. Lab Results  Component Value Date   CHOL 218* 02/03/2013   HDL 65.70 02/03/2013   LDLCALC 94 12/11/2011   LDLDIRECT 121.5 02/03/2013   TRIG 136.0 02/03/2013   CHOLHDL 3 02/03/2013  Exercising: in gym 5 days a week.  All labs reviewed in detail with pt. No DM, nml CMET.   She is doing well off GERD medication. No heartburn issues.  No nausea, no abdominal pain.   Chronic migraine: 10 migraine in the last 1-2 years.  Migraines occuring every few months now. Needs refill of sumatriptan 100 mg .   Facial swelling... Thought to be due to allergy... Saw allergies.. Resolved with prednisone taper.  Review of Systems  Constitutional: Negative for fever and fatigue.  HENT: Negative for ear pain.  Eyes: Negative for pain.  Respiratory: Negative for chest tightness and shortness of breath.  Cardiovascular: Negative for chest pain, palpitations and leg swelling.  Gastrointestinal: Negative for abdominal pain.  Genitourinary: Negative for dysuria.  Objective:   Physical Exam  Constitutional: Vital signs are normal. She appears well-developed and well-nourished. She is cooperative. Non-toxic appearance. She does not appear ill. No distress.  HENT:  Head: Normocephalic.  Right Ear: Hearing, tympanic membrane, external ear and ear canal normal.  Left Ear: Hearing, tympanic membrane, external ear and ear canal normal.  Nose: Nose normal.  Eyes: Conjunctivae normal, EOM and lids are normal. Pupils are equal, round, and reactive to light. No foreign bodies found.  Neck: Trachea normal and normal range of motion. Neck supple. Carotid bruit is not present. No mass and no thyromegaly present.  Cardiovascular: Normal rate, regular rhythm, S1 normal, S2 normal, normal heart sounds and intact distal pulses. Exam reveals no gallop.  No murmur heard.  Pulmonary/Chest:  Effort normal and breath sounds normal. No respiratory distress. She has no wheezes. She has no rhonchi. She has no rales.  Abdominal: Soft. Normal appearance and bowel sounds are normal. She exhibits no distension, no fluid wave, no abdominal bruit and no mass. There is no hepatosplenomegaly. There is no tenderness. There is no rebound, no guarding and no CVA tenderness. No hernia.  Genitourinary: Vagina normal and uterus normal. No breast swelling, tenderness, discharge or bleeding. Pelvic exam was performed with patient supine. There is no rash, tenderness or lesion on the right labia. There is no rash, tenderness or lesion on the left labia. Uterus is not enlarged and not tender. PAP  NOT PERFORMED Right adnexum displays no mass, no tenderness and no fullness. Left adnexum displays no mass, no tenderness and no fullness.  Lymphadenopathy:  She has no cervical adenopathy.  She has no axillary adenopathy.  Neurological: She is alert. She has normal strength. No cranial nerve deficit or sensory deficit.  Skin: Skin is warm, dry and intact. No rash noted.  Psychiatric: Her speech is normal and behavior is normal. Judgment normal. Her mood appears not anxious. Cognition and memory are normal. She does not exhibit a depressed mood.  Assessment & Plan:   The patient's preventative maintenance and recommended screening tests for an annual wellness exam were reviewed in full today.  Brought up to date unless services declined.  Counselled on the importance of diet, exercise, and its role in overall health and mortality.  The patient's FH and SH was reviewed, including their home life, tobacco status, and drug and  alcohol status.   Vaccines: uptodate with Tdap, flu refused  Mammo: last nml 04/2012 PAP/DVE: last pap nml 2013, on every 3 year schedule pap, dve yearly  Colon: 03/2009 small polyp , repeat in 10 years.  Nonsmoker

## 2013-10-06 ENCOUNTER — Other Ambulatory Visit: Payer: Self-pay

## 2013-10-06 DIAGNOSIS — Z1231 Encounter for screening mammogram for malignant neoplasm of breast: Secondary | ICD-10-CM

## 2013-10-07 ENCOUNTER — Encounter: Payer: Self-pay | Admitting: Family Medicine

## 2013-10-07 ENCOUNTER — Ambulatory Visit (INDEPENDENT_AMBULATORY_CARE_PROVIDER_SITE_OTHER): Payer: BC Managed Care – PPO | Admitting: Family Medicine

## 2013-10-07 VITALS — BP 126/79 | HR 56 | Temp 98.0°F | Ht 61.5 in | Wt 134.5 lb

## 2013-10-07 DIAGNOSIS — H9202 Otalgia, left ear: Secondary | ICD-10-CM

## 2013-10-07 DIAGNOSIS — G43709 Chronic migraine without aura, not intractable, without status migrainosus: Secondary | ICD-10-CM

## 2013-10-07 DIAGNOSIS — IMO0002 Reserved for concepts with insufficient information to code with codable children: Secondary | ICD-10-CM

## 2013-10-07 DIAGNOSIS — R5382 Chronic fatigue, unspecified: Secondary | ICD-10-CM

## 2013-10-07 DIAGNOSIS — R5383 Other fatigue: Secondary | ICD-10-CM | POA: Insufficient documentation

## 2013-10-07 LAB — CBC WITH DIFFERENTIAL/PLATELET
BASOS ABS: 0 10*3/uL (ref 0.0–0.1)
BASOS PCT: 0 % (ref 0–1)
EOS PCT: 8 % — AB (ref 0–5)
Eosinophils Absolute: 0.5 10*3/uL (ref 0.0–0.7)
HEMATOCRIT: 39.1 % (ref 36.0–46.0)
Hemoglobin: 13.2 g/dL (ref 12.0–15.0)
Lymphocytes Relative: 36 % (ref 12–46)
Lymphs Abs: 2.4 10*3/uL (ref 0.7–4.0)
MCH: 28 pg (ref 26.0–34.0)
MCHC: 33.8 g/dL (ref 30.0–36.0)
MCV: 82.8 fL (ref 78.0–100.0)
MONO ABS: 0.7 10*3/uL (ref 0.1–1.0)
Monocytes Relative: 10 % (ref 3–12)
NEUTROS ABS: 3.1 10*3/uL (ref 1.7–7.7)
Neutrophils Relative %: 46 % (ref 43–77)
PLATELETS: 206 10*3/uL (ref 150–400)
RBC: 4.72 MIL/uL (ref 3.87–5.11)
RDW: 14.3 % (ref 11.5–15.5)
WBC: 6.7 10*3/uL (ref 4.0–10.5)

## 2013-10-07 LAB — TSH: TSH: 4.52 u[IU]/mL — ABNORMAL HIGH (ref 0.350–4.500)

## 2013-10-07 LAB — VITAMIN B12: Vitamin B-12: 737 pg/mL (ref 211–911)

## 2013-10-07 MED ORDER — TOPIRAMATE 25 MG PO TABS: 25.0000 mg | ORAL_TABLET | Freq: Every day | ORAL | Status: DC

## 2013-10-07 MED ORDER — SUMATRIPTAN SUCCINATE 100 MG PO TABS: ORAL_TABLET | ORAL | Status: DC

## 2013-10-07 NOTE — Progress Notes (Signed)
   Subjective:    Patient ID: [name] [name], female    DOB: 04-09-1955, 58 y.o.   MRN: [id number]  HPI  58 year old female presents for med refill  for imitrex.  She continues to have migraine frequently, ongoing 1 times a month. She does continue to have daily headache that is more mild. She uses ibuprofen 600 mg occ, few times a week.   She has been treated for sinus infection by allergist in late august -early septemeber. Those symptoms resolved but she has left ear pain, worse when lying down.  Using claritin prn and nasal saline rinse. Intolerant of OTC or prescription medicated nasal spray.  Fatigue ongoing now for years. Even worse lately. Up several times a night.       Review of Systems  Constitutional: Negative for fever and fatigue.  HENT: Positive for ear pain. Negative for ear discharge.   Eyes: Negative for pain.  Respiratory: Negative for chest tightness and shortness of breath.   Cardiovascular: Negative for chest pain, palpitations and leg swelling.  Gastrointestinal: Negative for abdominal pain.  Genitourinary: Negative for dysuria.       Objective:   Physical Exam  Constitutional: She is oriented to person, place, and time. Vital signs are normal. She appears well-developed and well-nourished. She is cooperative.  Non-toxic appearance. She does not appear ill. No distress.  HENT:  Head: Normocephalic.  Right Ear: Hearing, tympanic membrane, external ear and ear canal normal. Tympanic membrane is not erythematous, not retracted and not bulging.  Left Ear: Hearing, tympanic membrane, external ear and ear canal normal. Tympanic membrane is not erythematous, not retracted and not bulging.  Nose: No mucosal edema or rhinorrhea. Right sinus exhibits no maxillary sinus tenderness and no frontal sinus tenderness. Left sinus exhibits no maxillary sinus tenderness and no frontal sinus tenderness.  Mouth/Throat: Uvula is midline, oropharynx is clear and moist and  mucous membranes are normal.  Eyes: Conjunctivae, EOM and lids are normal. Pupils are equal, round, and reactive to light. Lids are everted and swept, no foreign bodies found.  Neck: Trachea normal and normal range of motion. Neck supple. Carotid bruit is not present. No mass and no thyromegaly present.  Cardiovascular: Normal rate, regular rhythm, S1 normal, S2 normal, normal heart sounds, intact distal pulses and normal pulses.  Exam reveals no gallop and no friction rub.   No murmur heard. Pulmonary/Chest: Effort normal and breath sounds normal. Not tachypneic. No respiratory distress. She has no decreased breath sounds. She has no wheezes. She has no rhonchi. She has no rales.  Abdominal: Soft. Normal appearance and bowel sounds are normal. There is no tenderness.  Neurological: She is alert and oriented to person, place, and time. She has normal strength and normal reflexes. No cranial nerve deficit or sensory deficit. She exhibits normal muscle tone. She displays a negative Romberg sign. Coordination and gait normal. GCS eye subscore is 4. GCS verbal subscore is 5. GCS motor subscore is 6.  Nml cerebellar exam   No papilledema  Skin: Skin is warm, dry and intact. No rash noted.  Psychiatric: She has a normal mood and affect. Her speech is normal and behavior is normal. Judgment and thought content normal. Her mood appears not anxious. Cognition and memory are normal. Cognition and memory are not impaired. She does not exhibit a depressed mood. She exhibits normal recent memory and normal remote memory.          Assessment & Plan:

## 2013-10-07 NOTE — Assessment & Plan Note (Signed)
Chronic, with acute worsening. Will eval with labs.

## 2013-10-07 NOTE — Assessment & Plan Note (Signed)
Given daily headache, will start daily preventative med. Start topamax low dose.  Limit med to avoid med overuse headache.

## 2013-10-07 NOTE — Assessment & Plan Note (Signed)
Sharp pain recreated with pressure at lower jaew. ? Neuralgia type.  Topamax may also help with this issue.

## 2013-10-07 NOTE — Progress Notes (Signed)
Pre visit review using our clinic review tool, if applicable. No additional management support is needed unless otherwise documented below in the visit note. 

## 2013-10-07 NOTE — Patient Instructions (Addendum)
Start topamax at bedtime low dose . Keep a headache diary. Stop at lab on way out.

## 2013-10-08 LAB — COMPREHENSIVE METABOLIC PANEL
ALT: 15 U/L (ref 0–35)
AST: 23 U/L (ref 0–37)
Albumin: 4.2 g/dL (ref 3.5–5.2)
Alkaline Phosphatase: 67 U/L (ref 39–117)
BUN: 10 mg/dL (ref 6–23)
CALCIUM: 10 mg/dL (ref 8.4–10.5)
CO2: 27 meq/L (ref 19–32)
CREATININE: 0.82 mg/dL (ref 0.50–1.10)
Chloride: 104 mEq/L (ref 96–112)
Glucose, Bld: 84 mg/dL (ref 70–99)
Potassium: 4.2 mEq/L (ref 3.5–5.3)
Sodium: 138 mEq/L (ref 135–145)
Total Bilirubin: 0.3 mg/dL (ref 0.2–1.2)
Total Protein: 6.3 g/dL (ref 6.0–8.3)

## 2013-10-08 LAB — VITAMIN D 25 HYDROXY (VIT D DEFICIENCY, FRACTURES): VIT D 25 HYDROXY: 51 ng/mL (ref 30–89)

## 2013-10-11 ENCOUNTER — Other Ambulatory Visit: Payer: Self-pay | Admitting: Family Medicine

## 2013-10-11 DIAGNOSIS — R7989 Other specified abnormal findings of blood chemistry: Secondary | ICD-10-CM

## 2013-10-12 ENCOUNTER — Other Ambulatory Visit (INDEPENDENT_AMBULATORY_CARE_PROVIDER_SITE_OTHER): Payer: BC Managed Care – PPO

## 2013-10-12 DIAGNOSIS — R946 Abnormal results of thyroid function studies: Secondary | ICD-10-CM

## 2013-10-12 DIAGNOSIS — R7989 Other specified abnormal findings of blood chemistry: Secondary | ICD-10-CM

## 2013-10-12 LAB — T3, FREE: T3 FREE: 2.8 pg/mL (ref 2.3–4.2)

## 2013-10-12 LAB — T4, FREE: Free T4: 0.77 ng/dL (ref 0.60–1.60)

## 2013-10-21 ENCOUNTER — Ambulatory Visit
Admission: RE | Admit: 2013-10-21 | Discharge: 2013-10-21 | Disposition: A | Discharge status: Home / Self Care | Payer: BC Managed Care – PPO | Source: Ambulatory Visit

## 2013-10-21 DIAGNOSIS — Z1231 Encounter for screening mammogram for malignant neoplasm of breast: Secondary | ICD-10-CM

## 2013-11-08 ENCOUNTER — Ambulatory Visit (INDEPENDENT_AMBULATORY_CARE_PROVIDER_SITE_OTHER): Payer: BC Managed Care – PPO | Admitting: Family Medicine

## 2013-11-08 ENCOUNTER — Encounter: Payer: Self-pay | Admitting: Family Medicine

## 2013-11-08 VITALS — BP 104/62 | HR 62 | Temp 97.8°F | Ht 61.5 in | Wt 132.5 lb

## 2013-11-08 DIAGNOSIS — R5382 Chronic fatigue, unspecified: Secondary | ICD-10-CM

## 2013-11-08 DIAGNOSIS — H9202 Otalgia, left ear: Secondary | ICD-10-CM

## 2013-11-08 DIAGNOSIS — G43709 Chronic migraine without aura, not intractable, without status migrainosus: Secondary | ICD-10-CM

## 2013-11-08 DIAGNOSIS — IMO0002 Reserved for concepts with insufficient information to code with codable children: Secondary | ICD-10-CM

## 2013-11-08 NOTE — Patient Instructions (Addendum)
Try to wean off caffeine and artificial sweeteners.  Continue regular exercise. Increase topamax to 50 mg at bedtime. Continue headache diary. Follow up in 2 months for migraine follow up.  Call sooner if issues with topamax.

## 2013-11-08 NOTE — Assessment & Plan Note (Signed)
Nml ear exam. ? Neuralgia or migraine variant. Increase in topamax should help with this.

## 2013-11-08 NOTE — Assessment & Plan Note (Signed)
Lab eval nml. Likely due to poor sleep.

## 2013-11-08 NOTE — Assessment & Plan Note (Signed)
Discussed decreasing triggers and healthy lifestyle.  Improve sleep, stop caffeine and artificial sweeteners. Will increase topamax to 50 mg daily.

## 2013-11-08 NOTE — Progress Notes (Signed)
Pre visit review using our clinic review tool, if applicable. No additional management support is needed unless otherwise documented below in the visit note. 

## 2013-11-08 NOTE — Progress Notes (Signed)
Subjective:    Patient ID: [name] [name], female    DOB: Jan 16, 1955, 58 y.o.   MRN: [id number]  HPI  58 year old female presents for 1 month follow up of chronic migraine, left ear pain and fatigue. At last OV she described mild daily headache, with severe migraine 1 time a month as well as sharp pain in left ear felt to possibly be a neuralgia type pain.   At last OV she was started on low dose topamax 25 mg at bedtime. Recommended elimination of use of daily medication to avoid medication overuse headache.   Labs showed nml thyroid free t3 and free t4, nml cbc, nml B12, nml vit D. Gets up 2-3 times a night. Able to fall back asleep each time. Feels tired when waking up in AM. She does drink diet soda 3 cans a day, with caffeine.  She reports she has  Used imitrex one time. She has kept a headache diary.  She has noted that headache worse after strenuous weight lifting causing pain in shoulders and neck. She has avoided chocolate.  She does not skip breakfast.  Wt Readings from Last 3 Encounters:  11/08/13 132 lb 8 oz (60.102 kg)  10/07/13 134 lb 8 oz (61.009 kg)  02/10/13 133 lb 4 oz (60.442 kg)    Body mass index is 24.63 kg/(m^2).    Review of Systems  Constitutional: Negative for fever and fatigue.  HENT: Negative for ear pain.   Eyes: Negative for pain.  Respiratory: Negative for chest tightness and shortness of breath.   Cardiovascular: Negative for chest pain, palpitations and leg swelling.  Gastrointestinal: Negative for abdominal pain.  Genitourinary: Negative for dysuria.       Objective:   Physical Exam  Constitutional: Vital signs are normal. She appears well-developed and well-nourished. She is cooperative.  Non-toxic appearance. She does not appear ill. No distress.  HENT:  Head: Normocephalic.  Right Ear: Hearing, tympanic membrane, external ear and ear canal normal. Tympanic membrane is not erythematous, not retracted and not bulging.  Left Ear:  Hearing, tympanic membrane, external ear and ear canal normal. Tympanic membrane is not erythematous, not retracted and not bulging.  Nose: No mucosal edema or rhinorrhea. Right sinus exhibits no maxillary sinus tenderness and no frontal sinus tenderness. Left sinus exhibits no maxillary sinus tenderness and no frontal sinus tenderness.  Mouth/Throat: Uvula is midline, oropharynx is clear and moist and mucous membranes are normal.  Eyes: Conjunctivae, EOM and lids are normal. Pupils are equal, round, and reactive to light. Lids are everted and swept, no foreign bodies found.  Neck: Trachea normal and normal range of motion. Neck supple. Carotid bruit is not present. No thyroid mass and no thyromegaly present.  Cardiovascular: Normal rate, regular rhythm, S1 normal, S2 normal, normal heart sounds, intact distal pulses and normal pulses.  Exam reveals no gallop and no friction rub.   No murmur heard. Pulmonary/Chest: Effort normal and breath sounds normal. No tachypnea. No respiratory distress. She has no decreased breath sounds. She has no wheezes. She has no rhonchi. She has no rales.  Abdominal: Soft. Normal appearance and bowel sounds are normal. There is no tenderness.  Neurological: She is alert.  Skin: Skin is warm, dry and intact. No rash noted.  Psychiatric: Her speech is normal and behavior is normal. Judgment and thought content normal. Her mood appears not anxious. Cognition and memory are normal. She does not exhibit a depressed mood.  Assessment & Plan:

## 2014-01-17 ENCOUNTER — Encounter: Payer: Self-pay | Admitting: Family Medicine

## 2014-01-17 ENCOUNTER — Ambulatory Visit (INDEPENDENT_AMBULATORY_CARE_PROVIDER_SITE_OTHER): Payer: BLUE CROSS/BLUE SHIELD | Admitting: Family Medicine

## 2014-01-17 VITALS — BP 100/62 | HR 60 | Temp 98.3°F | Ht 61.5 in | Wt 129.5 lb

## 2014-01-17 DIAGNOSIS — G43709 Chronic migraine without aura, not intractable, without status migrainosus: Secondary | ICD-10-CM

## 2014-01-17 DIAGNOSIS — IMO0002 Reserved for concepts with insufficient information to code with codable children: Secondary | ICD-10-CM

## 2014-01-17 MED ORDER — TOPIRAMATE 50 MG PO TABS: 50.0000 mg | ORAL_TABLET | Freq: Every day | ORAL | Status: DC

## 2014-01-17 NOTE — Patient Instructions (Addendum)
Keep up healthy eating habits and regualr exercise. Continue topamax. Keep up the water , stay away from soda.

## 2014-01-17 NOTE — Progress Notes (Signed)
59 year old female presents for 2 month follow up of chronic migraine, left ear pain and fatigue.  At 10/2013 OV she described mild daily headache, with severe migraine 1 time a month as well as sharp pain in left ear felt to possibly be a neuralgia type pain. She was started on low dose topamax 25 mg at bedtime. Recommended elimination of use of daily medication to avoid medication overuse headache. Labs showed nml thyroid free t3 and free t4, nml cbc, nml B12, nml vit D. Gets up 2-3 times a night. Able to fall back asleep each time. Feels tired when waking up in AM. She does drink diet soda 3 cans a day, with caffeine.  11/2013 OV: Discussed decreasing triggers and healthy lifestyle. Improve sleep, stop caffeine and artificial sweeteners. Will increase topamax to 50 mg daily  Ear pain may be neuralgia syndrome.  TODAY: she reports she did have viral GE and sinus infection since last OV. She has not had a migraine in last 2 months.  She has not had to use imitrex at all.  No SE to topamax. She has been sleeping better overall.  She has lost 3 lbs. Wt Readings from Last 3 Encounters:  01/17/14 129 lb 8 oz (58.741 kg)  11/08/13 132 lb 8 oz (60.102 kg)  10/07/13 134 lb 8 oz (61.009 kg)   Review of Systems  Constitutional: Negative for fever and fatigue.  HENT: Negative for ear pain.  Eyes: Negative for pain.  Respiratory: Negative for chest tightness and shortness of breath.  Cardiovascular: Negative for chest pain, palpitations and leg swelling.  Gastrointestinal: Negative for abdominal pain.  Genitourinary: Negative for dysuria.       Objective:   Physical Exam  Constitutional: Vital signs are normal. She appears well-developed and well-nourished. She is cooperative. Non-toxic appearance. She does not appear ill. No distress.  HENT:  Head: Normocephalic.  Right Ear: Hearing, tympanic membrane, external ear and ear canal normal. Tympanic membrane is not erythematous,  not retracted and not bulging.  Left Ear: Hearing, tympanic membrane, external ear and ear canal normal. Tympanic membrane is not erythematous, not retracted and not bulging.  Nose: No mucosal edema or rhinorrhea. Right sinus exhibits no maxillary sinus tenderness and no frontal sinus tenderness. Left sinus exhibits no maxillary sinus tenderness and no frontal sinus tenderness.  Mouth/Throat: Uvula is midline, oropharynx is clear and moist and mucous membranes are normal.  Eyes: Conjunctivae, EOM and lids are normal. Pupils are equal, round, and reactive to light. Lids are everted and swept, no foreign bodies found.  Neck: Trachea normal and normal range of motion. Neck supple. Carotid bruit is not present. No thyroid mass and no thyromegaly present.  Cardiovascular: Normal rate, regular rhythm, S1 normal, S2 normal, normal heart sounds, intact distal pulses and normal pulses. Exam reveals no gallop and no friction rub.  No murmur heard. Pulmonary/Chest: Effort normal and breath sounds normal. No tachypnea. No respiratory distress. She has no decreased breath sounds. She has no wheezes. She has no rhonchi. She has no rales.  Abdominal: Soft. Normal appearance and bowel sounds are normal. There is no tenderness.  Neurological: She is alert.  Skin: Skin is warm, dry and intact. No rash noted.  Psychiatric: Her speech is normal and behavior is normal. Judgment and thought content normal. Her mood appears not anxious. Cognition and memory are normal. She does not exhibit a depressed mood.

## 2014-01-17 NOTE — Progress Notes (Signed)
Pre visit review using our clinic review tool, if applicable. No additional management support is needed unless otherwise documented below in the visit note. 

## 2014-01-17 NOTE — Assessment & Plan Note (Signed)
Improved control with tolpamax 50 mg daily.  Benefits include weight loss, ear pain improvement, less bloating, more energy, sleeping better.

## 2014-03-17 ENCOUNTER — Telehealth: Payer: Self-pay | Admitting: Family Medicine

## 2014-03-17 ENCOUNTER — Other Ambulatory Visit (INDEPENDENT_AMBULATORY_CARE_PROVIDER_SITE_OTHER): Payer: BLUE CROSS/BLUE SHIELD

## 2014-03-17 DIAGNOSIS — Z13220 Encounter for screening for lipoid disorders: Secondary | ICD-10-CM

## 2014-03-17 LAB — COMPREHENSIVE METABOLIC PANEL
ALT: 12 U/L (ref 0–35)
AST: 19 U/L (ref 0–37)
Albumin: 4.2 g/dL (ref 3.5–5.2)
Alkaline Phosphatase: 59 U/L (ref 39–117)
BUN: 12 mg/dL (ref 6–23)
CALCIUM: 9.6 mg/dL (ref 8.4–10.5)
CHLORIDE: 109 meq/L (ref 96–112)
CO2: 28 meq/L (ref 19–32)
CREATININE: 0.95 mg/dL (ref 0.40–1.20)
GFR: 63.97 mL/min (ref >60.00)
Glucose, Bld: 91 mg/dL (ref 70–99)
Potassium: 4 mEq/L (ref 3.5–5.1)
Sodium: 140 mEq/L (ref 135–145)
Total Bilirubin: 0.4 mg/dL (ref 0.2–1.2)
Total Protein: 6.5 g/dL (ref 6.0–8.3)

## 2014-03-17 LAB — LIPID PANEL
CHOL/HDL RATIO: 3
Cholesterol: 186 mg/dL (ref 0–200)
HDL: 62.7 mg/dL (ref >39.00)
LDL CALC: 104 mg/dL — AB (ref 0–99)
NonHDL: 123.3
TRIGLYCERIDES: 98 mg/dL (ref 0.0–149.0)
VLDL: 19.6 mg/dL (ref 0.0–40.0)

## 2014-03-17 NOTE — Telephone Encounter (Signed)
-----   Message from Alvina Chouerri J Walsh sent at 03/09/2014  4:56 PM EST ----- Regarding: Lab orders for Friday, 3.11.16 Patient is scheduled for CPX labs, please order future labs, Thanks , Camelia Engerri

## 2014-03-24 ENCOUNTER — Other Ambulatory Visit (HOSPITAL_COMMUNITY)
Admission: RE | Admit: 2014-03-24 | Discharge: 2014-03-24 | Disposition: A | Discharge status: Home / Self Care | Payer: BLUE CROSS/BLUE SHIELD | Source: Ambulatory Visit | Attending: Family Medicine | Admitting: Family Medicine

## 2014-03-24 ENCOUNTER — Encounter: Payer: Self-pay | Admitting: Family Medicine

## 2014-03-24 ENCOUNTER — Ambulatory Visit (INDEPENDENT_AMBULATORY_CARE_PROVIDER_SITE_OTHER): Payer: BLUE CROSS/BLUE SHIELD | Admitting: Family Medicine

## 2014-03-24 ENCOUNTER — Other Ambulatory Visit: Payer: Self-pay | Admitting: Family Medicine

## 2014-03-24 VITALS — BP 112/62 | HR 64 | Temp 97.6°F | Ht 61.5 in | Wt 128.5 lb

## 2014-03-24 DIAGNOSIS — Z01419 Encounter for gynecological examination (general) (routine) without abnormal findings: Secondary | ICD-10-CM | POA: Insufficient documentation

## 2014-03-24 DIAGNOSIS — Z1151 Encounter for screening for human papillomavirus (HPV): Secondary | ICD-10-CM | POA: Insufficient documentation

## 2014-03-24 DIAGNOSIS — G43709 Chronic migraine without aura, not intractable, without status migrainosus: Secondary | ICD-10-CM

## 2014-03-24 DIAGNOSIS — Z0000 Encounter for general adult medical examination without abnormal findings: Secondary | ICD-10-CM

## 2014-03-24 DIAGNOSIS — IMO0002 Reserved for concepts with insufficient information to code with codable children: Secondary | ICD-10-CM

## 2014-03-24 DIAGNOSIS — E348 Other specified endocrine disorders: Secondary | ICD-10-CM

## 2014-03-24 DIAGNOSIS — Z1159 Encounter for screening for other viral diseases: Secondary | ICD-10-CM

## 2014-03-24 NOTE — Progress Notes (Signed)
The patient is here for annual wellness exam and preventative care.    Has lost dog, increase stress, working more.  Some issues with insomnia, denies depression.  Exercise: Less than usual.  Diet; good  BP Readings from Last 3 Encounters:  03/24/14 112/62  01/17/14 100/62  11/08/13 104/62     Lab Results  Component Value Date   CHOL 186 03/17/2014   HDL 62.70 03/17/2014   LDLCALC 104* 03/17/2014   LDLDIRECT 121.5 02/03/2013   TRIG 98.0 03/17/2014   CHOLHDL 3 03/17/2014   All labs reviewed in detail with pt. No DM, nml CMET.    Chronic migraine: Improved frequency  On Topamax 50 mg daily. No SE.  Having less than 1 migraine per month, then lost dog, 2 migraines in last 6 weeks. Rescue: sumatriptan 100 mg  Facial swelling... Thought to be due to allergy... Saw allergies.. Resolved with prednisone taper.  Review of Systems  Constitutional: Negative for fever and fatigue.  HENT: Negative for ear pain.  Eyes: Negative for pain.  Respiratory: Negative for chest tightness and shortness of breath.  Cardiovascular: Negative for chest pain, palpitations and leg swelling.  Gastrointestinal: Negative for abdominal pain.  Genitourinary: Negative for dysuria.  Objective:   Physical Exam  Constitutional: Vital signs are normal. She appears well-developed and well-nourished. She is cooperative. Non-toxic appearance. She does not appear ill. No distress.  HENT:  Head: Normocephalic.  Right Ear: Hearing, tympanic membrane, external ear and ear canal normal.  Left Ear: Hearing, tympanic membrane, external ear and ear canal normal.  Small amount of fluid behind bilateral TMs. Nose: Nose normal.  Eyes: Conjunctivae normal, EOM and lids are normal. Pupils are equal, round, and reactive to light. No foreign bodies found.  Neck: Trachea normal and normal range of motion. Neck supple. Carotid bruit is not present. No mass and no thyromegaly present.  Cardiovascular: Normal  rate, regular rhythm, S1 normal, S2 normal, normal heart sounds and intact distal pulses. Exam reveals no gallop.  No murmur heard.  Pulmonary/Chest: Effort normal and breath sounds normal. No respiratory distress. She has no wheezes. She has no rhonchi. She has no rales.  Abdominal: Soft. Normal appearance and bowel sounds are normal. She exhibits no distension, no fluid wave, no abdominal bruit and no mass. There is no hepatosplenomegaly. There is no tenderness. There is no rebound, no guarding and no CVA tenderness. No hernia.  Genitourinary: Vagina normal and uterus normal. No breast swelling, tenderness, discharge or bleeding. Pelvic exam was performed with patient supine. There is no rash, tenderness or lesion on the right labia. There is no rash, tenderness or lesion on the left labia. Uterus is not enlarged and not tender. PAP performed, no cervical changes noted,. Right adnexum displays no mass, no tenderness and no fullness. Left adnexum displays no mass, no tenderness and no fullness.  Lymphadenopathy:  She has no cervical adenopathy.  She has no axillary adenopathy.  Neurological: She is alert. She has normal strength. No cranial nerve deficit or sensory deficit.  Skin: Skin is warm, dry and intact. No rash noted.  Psychiatric: Her speech is normal and behavior is normal. Judgment normal. Her mood appears not anxious. Cognition and memory are normal. She does not exhibit a depressed mood.  Assessment & Plan:   The patient's preventative maintenance and recommended screening tests for an annual wellness exam were reviewed in full today.  Brought up to date unless services declined.  Counselled on the importance of diet, exercise, and  its role in overall health and mortality.  The patient's FH and SH was reviewed, including their home life, tobacco status, and drug and alcohol status.   Vaccines: uptodate with Tdap, flu refused  Mammo: last nml 10/2013 PAP/DVE: last pap nml  2013, on every 3 year schedule pap, dve yearly  Colon: 03/2009 small polyp, repeat in 10 years.  Nonsmoker  Refuses HIV  Will do hep C screen.   Never had DXA: Family history of osteoporosis,  Frequent prednione use.

## 2014-03-24 NOTE — Addendum Note (Signed)
Addended by: Desmond DikeKNIGHT, Rosanna Bickle H on: 03/24/2014 12:07 PM   Modules accepted: Orders

## 2014-03-24 NOTE — Progress Notes (Signed)
Pre visit review using our clinic review tool, if applicable. No additional management support is needed unless otherwise documented below in the visit note. 

## 2014-03-24 NOTE — Assessment & Plan Note (Signed)
Improved on topamax 50 mg daily.

## 2014-03-24 NOTE — Patient Instructions (Addendum)
Get back on track with exercise, continue healthy eating. Stop at lab on way out. Stop at front desk for referral bone density

## 2014-03-25 LAB — HEPATITIS C ANTIBODY: HCV AB: NEGATIVE

## 2014-03-27 LAB — CYTOLOGY - PAP

## 2014-03-28 ENCOUNTER — Encounter: Payer: Self-pay | Admitting: *Deleted

## 2014-04-01 ENCOUNTER — Encounter: Payer: Self-pay | Admitting: Family Medicine

## 2014-04-03 ENCOUNTER — Telehealth: Payer: Self-pay | Admitting: Family Medicine

## 2014-04-03 NOTE — Telephone Encounter (Signed)
Can you check on the status of this?

## 2014-04-03 NOTE — Telephone Encounter (Signed)
Response to previous pt question.

## 2014-04-13 ENCOUNTER — Ambulatory Visit
Admission: RE | Admit: 2014-04-13 | Discharge: 2014-04-13 | Disposition: A | Discharge status: Home / Self Care | Payer: BLUE CROSS/BLUE SHIELD | Source: Ambulatory Visit | Attending: Family Medicine | Admitting: Family Medicine

## 2014-04-13 DIAGNOSIS — E348 Other specified endocrine disorders: Secondary | ICD-10-CM

## 2014-04-14 ENCOUNTER — Encounter: Payer: Self-pay | Admitting: Family Medicine

## 2014-04-14 DIAGNOSIS — M8580 Other specified disorders of bone density and structure, unspecified site: Secondary | ICD-10-CM | POA: Insufficient documentation

## 2014-11-21 ENCOUNTER — Encounter: Payer: Self-pay | Admitting: Family Medicine

## 2014-11-21 ENCOUNTER — Ambulatory Visit (INDEPENDENT_AMBULATORY_CARE_PROVIDER_SITE_OTHER): Payer: BLUE CROSS/BLUE SHIELD | Admitting: Family Medicine

## 2014-11-21 VITALS — BP 110/78 | HR 92 | Temp 97.2°F | Ht 61.5 in | Wt 132.2 lb

## 2014-11-21 DIAGNOSIS — IMO0002 Reserved for concepts with insufficient information to code with codable children: Secondary | ICD-10-CM

## 2014-11-21 DIAGNOSIS — G43709 Chronic migraine without aura, not intractable, without status migrainosus: Secondary | ICD-10-CM | POA: Diagnosis not present

## 2014-11-21 DIAGNOSIS — H01139 Eczematous dermatitis of unspecified eye, unspecified eyelid: Secondary | ICD-10-CM

## 2014-11-21 DIAGNOSIS — R21 Rash and other nonspecific skin eruption: Secondary | ICD-10-CM

## 2014-11-21 MED ORDER — SUMATRIPTAN SUCCINATE 100 MG PO TABS: ORAL_TABLET | ORAL | Status: DC

## 2014-11-21 MED ORDER — TRIAMCINOLONE ACETONIDE 0.1 % EX CREA: 1.0000 "application " | TOPICAL_CREAM | Freq: Two times a day (BID) | CUTANEOUS | Status: DC

## 2014-11-21 MED ORDER — TOPIRAMATE 100 MG PO TABS: 100.0000 mg | ORAL_TABLET | Freq: Every day | ORAL | Status: DC

## 2014-11-21 NOTE — Addendum Note (Signed)
Addended by: Damita LackLORING, DONNA S on: 11/21/2014 02:28 PM   Modules accepted: Orders

## 2014-11-21 NOTE — Assessment & Plan Note (Addendum)
Rash on buttocks concerning for herpes. Send viral culture. If negative.. May be allergic derm.

## 2014-11-21 NOTE — Assessment & Plan Note (Signed)
Treat with topical steroid BID x 2 weeks.

## 2014-11-21 NOTE — Patient Instructions (Addendum)
Increase dose of topamax for better control on migraines. Treat with topical steroid twice daily x 2 weeks.  We will call with results of culture.  Call if not improviing as expected.

## 2014-11-21 NOTE — Assessment & Plan Note (Signed)
Increase topamax to 100 mg at bedtime to see if we can get better control of migraine frequency.

## 2014-11-21 NOTE — Progress Notes (Signed)
Pre visit review using our clinic review tool, if applicable. No additional management support is needed unless otherwise documented below in the visit note. 

## 2014-11-21 NOTE — Progress Notes (Signed)
   Subjective:    Patient ID: [name] [name], female    DOB: [birth year], 59 y.o.   MRN: [id number]  HPI  59 year old female presents fro multiple concerns.  1. Migraine, common follow up: well controlled.  Headaches are less frequent but occ weekly to every 6-8 weeks.  On topamax 50 mg at bedtime. Using imitrex for rescue, makes migraine resolve.  Occ recurrence few days later.  She has noted 5 lb weight gain in last few weeks. Wt Readings from Last 3 Encounters:  11/21/14 132 lb 4 oz (59.988 kg)  03/24/14 128 lb 8 oz (58.287 kg)  01/17/14 129 lb 8 oz (58.741 kg)   2. Red bumps on buttocks, in last 3 weeks. Clusters of red, painful lesions on buttocks.. Clear liquid if expressed. Uses alcohol on the area. Has history of boils when a child., usually does not stay for long time.  3. Rash around her eyes, off and on in last 5 days. Swelling around  Bilateral eyes,  Red bumps.Thomes Cake. Itchy.    Review of Systems  Constitutional: Negative for fever and fatigue.  HENT: Negative for ear pain.   Eyes: Negative for pain.  Respiratory: Negative for chest tightness and shortness of breath.   Cardiovascular: Negative for chest pain, palpitations and leg swelling.  Gastrointestinal: Negative for abdominal pain.  Genitourinary: Negative for dysuria.       Objective:   Physical Exam  Constitutional: Vital signs are normal. She appears well-developed and well-nourished. She is cooperative.  Non-toxic appearance. She does not appear ill. No distress.  HENT:  Head: Normocephalic.  Right Ear: Hearing, tympanic membrane, external ear and ear canal normal. Tympanic membrane is not erythematous, not retracted and not bulging.  Left Ear: Hearing, tympanic membrane, external ear and ear canal normal. Tympanic membrane is not erythematous, not retracted and not bulging.  Nose: No mucosal edema or rhinorrhea. Right sinus exhibits no maxillary sinus tenderness and no frontal sinus tenderness. Left sinus  exhibits no maxillary sinus tenderness and no frontal sinus tenderness.  Mouth/Throat: Uvula is midline, oropharynx is clear and moist and mucous membranes are normal.  Eyes: Conjunctivae, EOM and lids are normal. Pupils are equal, round, and reactive to light. Lids are everted and swept, no foreign bodies found.  Neck: Trachea normal and normal range of motion. Neck supple. Carotid bruit is not present. No thyroid mass and no thyromegaly present.  Cardiovascular: Normal rate, regular rhythm, S1 normal, S2 normal, normal heart sounds, intact distal pulses and normal pulses.  Exam reveals no gallop and no friction rub.   No murmur heard. Pulmonary/Chest: Effort normal and breath sounds normal. No tachypnea. No respiratory distress. She has no decreased breath sounds. She has no wheezes. She has no rhonchi. She has no rales.  Abdominal: Soft. Normal appearance and bowel sounds are normal. There is no tenderness.  Neurological: She is alert.  Skin: Skin is warm, dry and intact. Rash noted.  Dry flaky red skin around upper and lower lid   Patches of vesicles on buttocks.. Unroofed and viral culture sent.  Psychiatric: Her speech is normal and behavior is normal. Judgment and thought content normal. Her mood appears not anxious. Cognition and memory are normal. She does not exhibit a depressed mood.          Assessment & Plan:

## 2014-11-22 ENCOUNTER — Telehealth: Payer: Self-pay

## 2014-11-22 NOTE — Telephone Encounter (Signed)
Pt left v/m requesting cb about imitrex refill; spoke with pt and she cked with pharmacy and has already picked up med; nothing further needed.

## 2014-11-23 ENCOUNTER — Telehealth: Payer: Self-pay | Admitting: Family Medicine

## 2014-11-23 DIAGNOSIS — B009 Herpesviral infection, unspecified: Secondary | ICD-10-CM

## 2014-11-23 LAB — HERPES SIMPLEX VIRUS CULTURE: ORGANISM ID, BACTERIA: DETECTED

## 2014-11-23 MED ORDER — VALACYCLOVIR HCL 1 G PO TABS: 1000.0000 mg | ORAL_TABLET | Freq: Two times a day (BID) | ORAL | Status: DC

## 2014-11-23 NOTE — Telephone Encounter (Signed)
Discussed with pt in detail

## 2015-02-06 ENCOUNTER — Ambulatory Visit: Payer: BLUE CROSS/BLUE SHIELD | Admitting: Family Medicine

## 2015-02-09 ENCOUNTER — Ambulatory Visit (INDEPENDENT_AMBULATORY_CARE_PROVIDER_SITE_OTHER): Payer: BLUE CROSS/BLUE SHIELD | Admitting: Family Medicine

## 2015-02-09 ENCOUNTER — Encounter: Payer: Self-pay | Admitting: Family Medicine

## 2015-02-09 VITALS — BP 100/60 | HR 66 | Temp 98.0°F | Ht 61.5 in | Wt 130.2 lb

## 2015-02-09 DIAGNOSIS — IMO0002 Reserved for concepts with insufficient information to code with codable children: Secondary | ICD-10-CM

## 2015-02-09 DIAGNOSIS — B009 Herpesviral infection, unspecified: Secondary | ICD-10-CM | POA: Diagnosis not present

## 2015-02-09 DIAGNOSIS — H01139 Eczematous dermatitis of unspecified eye, unspecified eyelid: Secondary | ICD-10-CM | POA: Diagnosis not present

## 2015-02-09 DIAGNOSIS — G43709 Chronic migraine without aura, not intractable, without status migrainosus: Secondary | ICD-10-CM | POA: Diagnosis not present

## 2015-02-09 DIAGNOSIS — R040 Epistaxis: Secondary | ICD-10-CM | POA: Diagnosis not present

## 2015-02-09 MED ORDER — VALACYCLOVIR HCL 500 MG PO TABS: ORAL_TABLET | ORAL | Status: DC

## 2015-02-09 NOTE — Progress Notes (Signed)
Pre visit review using our clinic review tool, if applicable. No additional management support is needed unless otherwise documented below in the visit note. 

## 2015-02-09 NOTE — Progress Notes (Signed)
   Subjective:    Patient ID: [name] [name], female    DOB: August 01, 1955, 60 y.o.   MRN: [id number]  HPI  60 year old female presents for follow up multiple issues:  1: Migraine, common: Increased Topamax to 100 mg at bedtime.  She has had only on migraine, more mild in last 3 months.  No SE on higher dose. Wt Readings from Last 3 Encounters:  02/09/15 130 lb 4 oz (59.081 kg)  11/21/14 132 lb 4 oz (59.988 kg)  03/24/14 128 lb 8 oz (58.287 kg)    2. Rash and swelling around eyes: felt at last OV to be atopic dermatitis of eyelid.  Treated with topical steroid. Rash resolved.  She is seeing opthalmologist for cyst on eyelid.  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 She has had 2 more flares of lesions on her buttocks since.  4. Nasal congestion in last week,  Sinus pressure,some blood nasal discharge. No fever, no SOB, no ear pain.  Using sinus rinse saline.  Review of Systems  Constitutional: Negative for fever and fatigue.  HENT: Negative for ear pain.   Eyes: Negative for pain.  Respiratory: Negative for chest tightness and shortness of breath.   Cardiovascular: Negative for chest pain, palpitations and leg swelling.  Gastrointestinal: Negative for abdominal pain.  Genitourinary: Negative for dysuria.       Objective:   Physical Exam  Constitutional: Vital signs are normal. She appears well-developed and well-nourished. She is cooperative.  Non-toxic appearance. She does not appear ill. No distress.  HENT:  Head: Normocephalic.  Right Ear: Hearing, tympanic membrane, external ear and ear canal normal. Tympanic membrane is not erythematous, not retracted and not bulging. No middle ear effusion.  Left Ear: Hearing, tympanic membrane, external ear and ear canal normal. Tympanic membrane is not erythematous, not retracted and not bulging.  No middle ear effusion.  Nose: No mucosal edema or rhinorrhea. Right sinus exhibits no maxillary  sinus tenderness and no frontal sinus tenderness. Left sinus exhibits no maxillary sinus tenderness and no frontal sinus tenderness.  Mouth/Throat: Uvula is midline, oropharynx is clear and moist and mucous membranes are normal.  Scab on left septum  Eyes: Conjunctivae, EOM and lids are normal. Pupils are equal, round, and reactive to light. Lids are everted and swept, no foreign bodies found.    Postinflammatory changes on left lower lid  no rash  Neck: Trachea normal and normal range of motion. Neck supple. Carotid bruit is not present. No thyroid mass and no thyromegaly present.  Cardiovascular: Normal rate, regular rhythm, S1 normal, S2 normal, normal heart sounds, intact distal pulses and normal pulses.  Exam reveals no gallop and no friction rub.   No murmur heard. Pulmonary/Chest: Effort normal and breath sounds normal. No tachypnea. No respiratory distress. She has no decreased breath sounds. She has no wheezes. She has no rhonchi. She has no rales.  Abdominal: Soft. Normal appearance and bowel sounds are normal. There is no tenderness.  Neurological: She is alert.  Skin: Skin is warm, dry and intact. No rash noted.  Psychiatric: Her speech is normal and behavior is normal. Judgment and thought content normal. Her mood appears not anxious. Cognition and memory are normal. She does not exhibit a depressed mood.          Assessment & Plan:

## 2015-02-09 NOTE — Assessment & Plan Note (Signed)
Resolved. Still some post inflammatory changes on left lower lid. Pt reassured.

## 2015-02-09 NOTE — Assessment & Plan Note (Addendum)
No clear sinus infection. Hole saline temporarily as likely dislodging scab repeatedly.

## 2015-02-09 NOTE — Patient Instructions (Signed)
Continue topamax 100 mg daily.  Use valacylovir at the first sign of flare.  Hold saline rinse to let scab in left nostril heal.

## 2015-02-09 NOTE — Assessment & Plan Note (Signed)
Decreased frequency on topamax 100 mg.

## 2015-02-09 NOTE — Assessment & Plan Note (Signed)
Given valacyclovir for further flares.

## 2015-03-23 ENCOUNTER — Telehealth: Payer: Self-pay | Admitting: Family Medicine

## 2015-03-23 ENCOUNTER — Other Ambulatory Visit (INDEPENDENT_AMBULATORY_CARE_PROVIDER_SITE_OTHER): Payer: BLUE CROSS/BLUE SHIELD

## 2015-03-23 DIAGNOSIS — M8580 Other specified disorders of bone density and structure, unspecified site: Secondary | ICD-10-CM | POA: Diagnosis not present

## 2015-03-23 DIAGNOSIS — Z13220 Encounter for screening for lipoid disorders: Secondary | ICD-10-CM

## 2015-03-23 LAB — COMPREHENSIVE METABOLIC PANEL
ALT: 15 U/L (ref 0–35)
AST: 20 U/L (ref 0–37)
Albumin: 4.1 g/dL (ref 3.5–5.2)
Alkaline Phosphatase: 63 U/L (ref 39–117)
BUN: 11 mg/dL (ref 6–23)
CHLORIDE: 109 meq/L (ref 96–112)
CO2: 26 mEq/L (ref 19–32)
Calcium: 9.5 mg/dL (ref 8.4–10.5)
Creatinine, Ser: 1 mg/dL (ref 0.40–1.20)
GFR: 60.08 mL/min (ref >60.00)
GLUCOSE: 85 mg/dL (ref 70–99)
POTASSIUM: 3.9 meq/L (ref 3.5–5.1)
Sodium: 142 mEq/L (ref 135–145)
Total Bilirubin: 0.5 mg/dL (ref 0.2–1.2)
Total Protein: 6.6 g/dL (ref 6.0–8.3)

## 2015-03-23 LAB — LIPID PANEL
CHOLESTEROL: 205 mg/dL — AB (ref 0–200)
HDL: 55.3 mg/dL (ref >39.00)
LDL CALC: 122 mg/dL — AB (ref 0–99)
NONHDL: 149.86
Total CHOL/HDL Ratio: 4
Triglycerides: 140 mg/dL (ref 0.0–149.0)
VLDL: 28 mg/dL (ref 0.0–40.0)

## 2015-03-23 LAB — VITAMIN D 25 HYDROXY (VIT D DEFICIENCY, FRACTURES): VITD: 32.53 ng/mL (ref 30.00–100.00)

## 2015-03-23 NOTE — Telephone Encounter (Signed)
-----   Message from Baldomero LamyNatasha C Chavers sent at 03/15/2015  1:48 PM EST ----- Regarding: Cpx labs 3/17, need orders. Thanks! :-) Please order  future cpx labs for pt's upcoming lab appt. Thanks Rodney Boozeasha

## 2015-03-30 ENCOUNTER — Encounter: Payer: Self-pay | Admitting: Family Medicine

## 2015-03-30 ENCOUNTER — Ambulatory Visit (INDEPENDENT_AMBULATORY_CARE_PROVIDER_SITE_OTHER): Payer: BLUE CROSS/BLUE SHIELD | Admitting: Family Medicine

## 2015-03-30 VITALS — BP 108/60 | HR 58 | Temp 97.5°F | Ht 61.5 in | Wt 128.2 lb

## 2015-03-30 DIAGNOSIS — G43709 Chronic migraine without aura, not intractable, without status migrainosus: Secondary | ICD-10-CM

## 2015-03-30 DIAGNOSIS — IMO0002 Reserved for concepts with insufficient information to code with codable children: Secondary | ICD-10-CM

## 2015-03-30 DIAGNOSIS — M8580 Other specified disorders of bone density and structure, unspecified site: Secondary | ICD-10-CM

## 2015-03-30 DIAGNOSIS — Z0000 Encounter for general adult medical examination without abnormal findings: Secondary | ICD-10-CM | POA: Diagnosis not present

## 2015-03-30 NOTE — Progress Notes (Signed)
Pre visit review using our clinic review tool, if applicable. No additional management support is needed unless otherwise documented below in the visit note. 

## 2015-03-30 NOTE — Assessment & Plan Note (Signed)
Stable control on topamax

## 2015-03-30 NOTE — Patient Instructions (Addendum)
Get back to exercise as you can.  Call if migraines continue to be more frequent.  Ca and vit D 600mg /400IU 1-2 daily.  Call insurance about shingles vaccine.  Call to schedule mammogram.

## 2015-03-30 NOTE — Progress Notes (Signed)
The patient is here for annual wellness exam and preventative care.   Exercise: Less than usual. Diet: good  BP Readings from Last 3 Encounters:  03/30/15 108/60  02/09/15 100/60  11/21/14 110/78   Lab Results  Component Value Date   CHOL 205* 03/23/2015   HDL 55.30 03/23/2015   LDLCALC 122* 03/23/2015   LDLDIRECT 121.5 02/03/2013   TRIG 140.0 03/23/2015   CHOLHDL 4 03/23/2015   All labs reviewed in detail with pt. No DM, nml CMET.    Chronic migraine: Improved frequency On Topamax 100 mg daily. No SE. Having less than 1 migraine per month, but 2 migraines in last 2 weeks, ? Trigger. They are painting. Rescue: sumatriptan 100 mg   Facial swelling... Thought to be due to allergy... Saw allergies.. Resolved with prednisone taper.  Review of Systems  Constitutional: Negative for fever and fatigue.  HENT: Negative for ear pain.  Eyes: Negative for pain.  Respiratory: Negative for chest tightness and shortness of breath.  Cardiovascular: Negative for chest pain, palpitations and leg swelling.  Gastrointestinal: Negative for abdominal pain.  Genitourinary: Negative for dysuria.  Objective:   Physical Exam  Constitutional: Vital signs are normal. She appears well-developed and well-nourished. She is cooperative. Non-toxic appearance. She does not appear ill. No distress.  HENT:  Head: Normocephalic.  Right Ear: Hearing, tympanic membrane, external ear and ear canal normal.  Left Ear: Hearing, tympanic membrane, external ear and ear canal normal.  Nose: Nose normal.  Eyes: Conjunctivae normal, EOM and lids are normal. Pupils are equal, round, and reactive to light. No foreign bodies found.  Neck: Trachea normal and normal range of motion. Neck supple. Carotid bruit is not present. No mass and no thyromegaly present.  Cardiovascular: Normal rate, regular rhythm, S1 normal, S2 normal, normal heart sounds and intact distal pulses. Exam reveals no gallop.  No  murmur heard.  Pulmonary/Chest: Effort normal and breath sounds normal. No respiratory distress. She has no wheezes. She has no rhonchi. She has no rales.  Abdominal: Soft. Normal appearance and bowel sounds are normal. She exhibits no distension, no fluid wave, no abdominal bruit and no mass. There is no hepatosplenomegaly. There is no tenderness. There is no rebound, no guarding and no CVA tenderness. No hernia.  Genitourinary: Vagina normal and uterus normal. No breast swelling, tenderness, discharge or bleeding. Pelvic exam was performed with patient supine. There is no rash, tenderness or lesion on the right labia. There is no rash, tenderness or lesion on the left labia. Uterus is not enlarged and not tender. PAP  NOT performed.  Right adnexum displays no mass, no tenderness and no fullness. Left adnexum displays no mass, no tenderness and no fullness.  Lymphadenopathy:  She has no cervical adenopathy.  She has no axillary adenopathy.  Neurological: She is alert. She has normal strength. No cranial nerve deficit or sensory deficit.  Skin: Skin is warm, dry and intact. No rash noted.  Psychiatric: Her speech is normal and behavior is normal. Judgment normal. Her mood appears not anxious. Cognition and memory are normal. She does not exhibit a depressed mood.  Assessment & Plan:   The patient's preventative maintenance and recommended screening tests for an annual wellness exam were reviewed in full today.  Brought up to date unless services declined.  Counselled on the importance of diet, exercise, and its role in overall health and mortality.  The patient's FH and SH was reviewed, including their home life, tobacco status, and drug and alcohol  status.   Vaccines: uptodate with Tdap, flu refused, will consider shingles.  Mammo: last nml 10/2013 PAP/DVE: last pap nml 2016, on every 3 year schedule pap, dve yearly  Colon: 03/2009 small polyp, repeat in 10 years.  Nonsmoker   Refuses HIV. Hep C screen: neg DEXA: 04/2014. Osteopenia, repeat in 2-5 years. Family hx of osteoporosis and frequent pred use.

## 2015-03-30 NOTE — Assessment & Plan Note (Signed)
Exercise and vit D/Ca

## 2015-05-22 ENCOUNTER — Other Ambulatory Visit: Payer: Self-pay

## 2015-05-22 MED ORDER — TOPIRAMATE 100 MG PO TABS: 100.0000 mg | ORAL_TABLET | Freq: Every day | ORAL | Status: DC

## 2015-05-22 NOTE — Telephone Encounter (Signed)
Pt requesting a refill. Last refill 11/21/14 #30 + 5. Last OV 03/30/15. Ok to refill?

## 2015-07-12 DIAGNOSIS — J301 Allergic rhinitis due to pollen: Secondary | ICD-10-CM | POA: Diagnosis not present

## 2015-07-17 ENCOUNTER — Other Ambulatory Visit: Payer: Self-pay | Admitting: *Deleted

## 2015-07-17 MED ORDER — SUMATRIPTAN SUCCINATE 100 MG PO TABS: ORAL_TABLET | ORAL | Status: DC

## 2015-07-25 DIAGNOSIS — J301 Allergic rhinitis due to pollen: Secondary | ICD-10-CM | POA: Diagnosis not present

## 2015-08-08 ENCOUNTER — Other Ambulatory Visit: Payer: Self-pay | Admitting: Family Medicine

## 2015-08-08 DIAGNOSIS — Z1231 Encounter for screening mammogram for malignant neoplasm of breast: Secondary | ICD-10-CM

## 2015-08-08 DIAGNOSIS — J301 Allergic rhinitis due to pollen: Secondary | ICD-10-CM | POA: Diagnosis not present

## 2015-08-16 DIAGNOSIS — J301 Allergic rhinitis due to pollen: Secondary | ICD-10-CM | POA: Diagnosis not present

## 2015-08-29 DIAGNOSIS — J301 Allergic rhinitis due to pollen: Secondary | ICD-10-CM | POA: Diagnosis not present

## 2015-08-31 ENCOUNTER — Ambulatory Visit
Admission: RE | Admit: 2015-08-31 | Discharge: 2015-08-31 | Disposition: A | Discharge status: Home / Self Care | Payer: BLUE CROSS/BLUE SHIELD | Source: Ambulatory Visit | Attending: Family Medicine | Admitting: Family Medicine

## 2015-08-31 DIAGNOSIS — Z1231 Encounter for screening mammogram for malignant neoplasm of breast: Secondary | ICD-10-CM

## 2015-09-05 DIAGNOSIS — J301 Allergic rhinitis due to pollen: Secondary | ICD-10-CM | POA: Diagnosis not present

## 2015-09-07 DIAGNOSIS — J301 Allergic rhinitis due to pollen: Secondary | ICD-10-CM | POA: Diagnosis not present

## 2015-09-18 DIAGNOSIS — L814 Other melanin hyperpigmentation: Secondary | ICD-10-CM | POA: Diagnosis not present

## 2015-09-18 DIAGNOSIS — D235 Other benign neoplasm of skin of trunk: Secondary | ICD-10-CM | POA: Diagnosis not present

## 2015-09-18 DIAGNOSIS — Z85828 Personal history of other malignant neoplasm of skin: Secondary | ICD-10-CM | POA: Diagnosis not present

## 2015-09-18 DIAGNOSIS — L821 Other seborrheic keratosis: Secondary | ICD-10-CM | POA: Diagnosis not present

## 2015-09-20 DIAGNOSIS — J301 Allergic rhinitis due to pollen: Secondary | ICD-10-CM | POA: Diagnosis not present

## 2015-10-04 DIAGNOSIS — J301 Allergic rhinitis due to pollen: Secondary | ICD-10-CM | POA: Diagnosis not present

## 2015-10-24 DIAGNOSIS — J301 Allergic rhinitis due to pollen: Secondary | ICD-10-CM | POA: Diagnosis not present

## 2015-11-07 DIAGNOSIS — J301 Allergic rhinitis due to pollen: Secondary | ICD-10-CM | POA: Diagnosis not present

## 2015-11-17 ENCOUNTER — Other Ambulatory Visit: Payer: Self-pay | Admitting: Family Medicine

## 2015-11-26 DIAGNOSIS — J301 Allergic rhinitis due to pollen: Secondary | ICD-10-CM | POA: Diagnosis not present

## 2015-11-28 ENCOUNTER — Other Ambulatory Visit: Payer: Self-pay | Admitting: Family Medicine

## 2015-11-28 ENCOUNTER — Ambulatory Visit
Admission: RE | Admit: 2015-11-28 | Discharge: 2015-11-28 | Disposition: A | Discharge status: Home / Self Care | Payer: BLUE CROSS/BLUE SHIELD | Source: Ambulatory Visit | Attending: Family Medicine | Admitting: Family Medicine

## 2015-11-28 ENCOUNTER — Ambulatory Visit (INDEPENDENT_AMBULATORY_CARE_PROVIDER_SITE_OTHER)
Admission: RE | Admit: 2015-11-28 | Discharge: 2015-11-28 | Disposition: A | Discharge status: Home / Self Care | Payer: BLUE CROSS/BLUE SHIELD | Source: Ambulatory Visit | Attending: Family Medicine | Admitting: Family Medicine

## 2015-11-28 ENCOUNTER — Encounter: Payer: Self-pay | Admitting: Family Medicine

## 2015-11-28 ENCOUNTER — Telehealth: Payer: Self-pay

## 2015-11-28 ENCOUNTER — Ambulatory Visit (INDEPENDENT_AMBULATORY_CARE_PROVIDER_SITE_OTHER): Payer: BLUE CROSS/BLUE SHIELD | Admitting: Family Medicine

## 2015-11-28 VITALS — BP 118/72 | HR 68 | Temp 98.3°F | Ht 61.5 in | Wt 125.3 lb

## 2015-11-28 DIAGNOSIS — R1011 Right upper quadrant pain: Secondary | ICD-10-CM | POA: Insufficient documentation

## 2015-11-28 DIAGNOSIS — G8929 Other chronic pain: Secondary | ICD-10-CM

## 2015-11-28 DIAGNOSIS — J309 Allergic rhinitis, unspecified: Secondary | ICD-10-CM

## 2015-11-28 DIAGNOSIS — J329 Chronic sinusitis, unspecified: Secondary | ICD-10-CM | POA: Diagnosis not present

## 2015-11-28 LAB — POCT URINALYSIS DIPSTICK
Bilirubin, UA: NEGATIVE
Glucose, UA: NEGATIVE
Ketones, UA: NEGATIVE
Nitrite, UA: NEGATIVE
Protein, UA: NEGATIVE
RBC UA: NEGATIVE
Spec Grav, UA: 1.015
UROBILINOGEN UA: 0.2
pH, UA: 7

## 2015-11-28 LAB — CBC WITH DIFFERENTIAL/PLATELET
BASOS ABS: 0 10*3/uL (ref 0.0–0.1)
Basophils Relative: 0.4 % (ref 0.0–3.0)
EOS ABS: 0.4 10*3/uL (ref 0.0–0.7)
Eosinophils Relative: 5.8 % — ABNORMAL HIGH (ref 0.0–5.0)
HEMATOCRIT: 41.5 % (ref 36.0–46.0)
HEMOGLOBIN: 13.7 g/dL (ref 12.0–15.0)
LYMPHS PCT: 32.3 % (ref 12.0–46.0)
Lymphs Abs: 2.3 10*3/uL (ref 0.7–4.0)
MCHC: 33 g/dL (ref 30.0–36.0)
MCV: 85.7 fl (ref 78.0–100.0)
Monocytes Absolute: 0.7 10*3/uL (ref 0.1–1.0)
Monocytes Relative: 9.2 % (ref 3.0–12.0)
Neutro Abs: 3.7 10*3/uL (ref 1.4–7.7)
Neutrophils Relative %: 52.3 % (ref 43.0–77.0)
Platelets: 207 10*3/uL (ref 150.0–400.0)
RBC: 4.84 Mil/uL (ref 3.87–5.11)
RDW: 13.6 % (ref 11.5–15.5)
WBC: 7.1 10*3/uL (ref 4.0–10.5)

## 2015-11-28 LAB — HEPATIC FUNCTION PANEL
ALT: 13 U/L (ref 0–35)
AST: 17 U/L (ref 0–37)
Albumin: 4.3 g/dL (ref 3.5–5.2)
Alkaline Phosphatase: 60 U/L (ref 39–117)
Bilirubin, Direct: 0.1 mg/dL (ref 0.0–0.3)
TOTAL PROTEIN: 6.9 g/dL (ref 6.0–8.3)
Total Bilirubin: 0.5 mg/dL (ref 0.2–1.2)

## 2015-11-28 LAB — BASIC METABOLIC PANEL
BUN: 10 mg/dL (ref 6–23)
CO2: 26 meq/L (ref 19–32)
Calcium: 9.6 mg/dL (ref 8.4–10.5)
Chloride: 109 mEq/L (ref 96–112)
Creatinine, Ser: 0.88 mg/dL (ref 0.40–1.20)
GFR: 69.47 mL/min (ref >60.00)
Glucose, Bld: 89 mg/dL (ref 70–99)
POTASSIUM: 3.7 meq/L (ref 3.5–5.1)
Sodium: 143 mEq/L (ref 135–145)

## 2015-11-28 LAB — LIPASE: Lipase: 15 U/L (ref 11.0–59.0)

## 2015-11-28 LAB — AMYLASE: Amylase: 54 U/L (ref 27–131)

## 2015-11-28 MED ORDER — HYDROCODONE-ACETAMINOPHEN 7.5-300 MG PO TABS: ORAL_TABLET | ORAL | 0 refills | Status: DC

## 2015-11-28 MED ORDER — ONDANSETRON HCL 4 MG PO TABS: 4.0000 mg | ORAL_TABLET | Freq: Three times a day (TID) | ORAL | 0 refills | Status: DC | PRN

## 2015-11-28 MED ORDER — PREDNISONE 20 MG PO TABS: ORAL_TABLET | ORAL | 1 refills | Status: DC

## 2015-11-28 NOTE — Telephone Encounter (Signed)
Negative RUQ U/S. Does have 2 simple benign cysts in Liver: #1 7mm #2 8mm. I advised they could send her home

## 2015-11-28 NOTE — Patient Instructions (Addendum)
Go to the main office now for your sinus x-rays....... [facility] around 4 PM to get the report  Clear liquid diet  Zofran 4 mg.......... one every 4-6 hours as needed for nausea  Vicodin.......Marland Kitchen. 1/2-1 tablet every 4-6 hours as needed for severe pain  Rest at home  For the head congestion I would recommend the following......... saline nasal irrigation..... Afrin nasal spray...Marland Kitchen.Marland Kitchen.Marland Kitchen. 1 shot up each nostril..........Marland Kitchen. tilt your head back for 10 minutes....... then steroid nasal spray.......Marland Kitchen. 1 shot up each nostril....... do this daily..... The Afrin has a five-day limit  Prednisone 20 mg........ 2 tabs 3 days taper as outlined  If indeed there is infection...Marland Kitchen.Marland Kitchen.Marland Kitchen. the doctors [place] will prescribe you an antibiotic  Also make an appointment to see her doctor next Monday to follow up on the ultrasound,,,,,,,,,,, hopefully this with ultrasound will be done on Friday  If your pain becomes worse or the nausea and vomiting persist go directly to the emergency room

## 2015-11-28 NOTE — Progress Notes (Signed)
Pre visit review using our clinic review tool, if applicable. No additional management support is needed unless otherwise documented below in the visit note. 

## 2015-11-28 NOTE — Progress Notes (Signed)
[name] [name] is a 60 year old married female nonsmoker who comes here today from [facility] for evaluation of 2 issues  She tried to call and [facility]. She was told they were too busy and couldn't see her. She then tried to call her allergist but they wouldn't see her either  She states she has a history of allergic rhinitis and sinus surgery 2. She is on allergy shots V an allergist in [place]. On Sunday her allergies flared up with head congestion postnasal drip and discomfort in her right frontal sinus. Today the left and right maxillary areas are sore. No fever chills etc. etc. No history of asthma.  The second problem is abdominal pain and PC vomiting  She says for the last 2 years about she's had episodes where she will get sick if she eats. On Sunday around 6 PM she ate a pizza and immediately became nauseated. All day Monday she was nauseating could meet. Last night she started vomiting and vomited 5 times over the course of 8 hours. She had no fever chills or diarrhea. She describes the pain is sharp midepigastric radiating up to her back occasionally. She quantifies the pain as an 8 on a scale of 1-10.  Family history she's not sure if there is gallbladder disease runs in her family  She is an ex alcoholic she says she's been dry for 30 years  Weight 125, temp 98.3 pop, pulse 68 and regular, BP 118/72  Examination of the HEENT were negative except for marked nasal congestion. Neck was supple no adenopathy  In the supine position the abdomen is flat sounds normal liver spleen kidneys not enlarged is palpable tenderness in the right upper quadrant. No rebound.  #1 allergic rhinitis chronic with flare..... Rule out sinusitis..... Begin nasal program with saline Afrin and steroid nasal spray.... And oral redness of........ x-ray sinuses today  #2......2 year history of abdominal pain after meals.......... clear liquid diet........Marland Kitchen. Zofran 4 mg 3 times  a day for nausea......Marland Kitchen. Vicodin 5 mg every 6 hours when necessary for severe pain...Marland Kitchen.Marland Kitchen.Marland Kitchen. ultrasound the gallbladder ASAP...Marland Kitchen.Marland Kitchen.Marland Kitchen. follow-up with PCP

## 2015-11-30 NOTE — Telephone Encounter (Signed)
Agree - I will send to Dr. Leonard SchwartzB.  This patient was seen and evaluated by Dr. Tawanna Coolerodd.

## 2015-12-03 ENCOUNTER — Ambulatory Visit (INDEPENDENT_AMBULATORY_CARE_PROVIDER_SITE_OTHER): Payer: BLUE CROSS/BLUE SHIELD | Admitting: Family Medicine

## 2015-12-03 ENCOUNTER — Encounter: Payer: Self-pay | Admitting: Family Medicine

## 2015-12-03 VITALS — BP 117/64 | HR 59 | Temp 98.3°F | Ht 61.5 in | Wt 121.5 lb

## 2015-12-03 DIAGNOSIS — R1013 Epigastric pain: Secondary | ICD-10-CM

## 2015-12-03 MED ORDER — SUCRALFATE 1 GM/10ML PO SUSP: 1.0000 g | Freq: Three times a day (TID) | ORAL | 0 refills | Status: DC

## 2015-12-03 NOTE — Progress Notes (Signed)
Pre visit review using our clinic review tool, if applicable. No additional management support is needed unless otherwise documented below in the visit note. 

## 2015-12-03 NOTE — Patient Instructions (Addendum)
Start prilosec/nexium 40 mg daily ( 2 x 20 mg)   Use carafate as needed for epigastric pain.  Stop at lab on way out and front desk.

## 2015-12-03 NOTE — Progress Notes (Signed)
   Subjective:    Patient ID: [name] [name], female    DOB: 1955/09/10, 60 y.o.   MRN: [id number]  HPI   60 year old female pt seen on 11/22 for abdominal pain and post-prandial emesis.and was  Symptoms off and on for 2 years.  Prior to 11/22 appt she ate pizza  nauseated, followed by vomiting 5 times. Pain in epigastrum, 8/10 on pain scale.   US showed nml gallbladder, 2 benign appearing cysts in liver.  Lipase, cbc, cmet , amylase neg.     Her baseline is: She reports that since then she cannot eat past 5 PM.  If she does she feels very full, nausea, pain in epigastrum.  During the day she can eat anything with minimal symptoms.  In past prilosec did not help much.  In last week she has been eating very little, still throwing up  Zofran not helping. Tried zantac..seemed to help.. No emesis in last 2 days. Only eating toast. Water, gingerale. Still very nauseous.. Pain in epigastrum still present today.. 1-1.    She has been losing weight . Wt Readings from Last 3 Encounters:  12/03/15 121 lb 8 oz (55.1 kg)  11/28/15 125 lb 4.8 oz (56.8 kg)  03/30/15 128 lb 4 oz (58.2 kg)    Review of Systems  Constitutional: Positive for fatigue. Negative for fever.  HENT: Negative for ear pain.   Eyes: Negative for pain.  Respiratory: Negative for shortness of breath.   Cardiovascular: Negative for chest pain.       Objective:   Physical Exam  Constitutional: Vital signs are normal. She appears well-developed and well-nourished. She is cooperative.  Non-toxic appearance. She does not appear ill. No distress.  HENT:  Head: Normocephalic.  Right Ear: Hearing, tympanic membrane, external ear and ear canal normal. Tympanic membrane is not erythematous, not retracted and not bulging.  Left Ear: Hearing, tympanic membrane, external ear and ear canal normal. Tympanic membrane is not erythematous, not retracted and not bulging.  Nose: No mucosal edema or rhinorrhea. Right sinus exhibits no  maxillary sinus tenderness and no frontal sinus tenderness. Left sinus exhibits no maxillary sinus tenderness and no frontal sinus tenderness.  Mouth/Throat: Uvula is midline, oropharynx is clear and moist and mucous membranes are normal.  Eyes: Conjunctivae, EOM and lids are normal. Pupils are equal, round, and reactive to light. Lids are everted and swept, no foreign bodies found.  Neck: Trachea normal and normal range of motion. Neck supple. Carotid bruit is not present. No thyroid mass and no thyromegaly present.  Cardiovascular: Normal rate, regular rhythm, S1 normal, S2 normal, normal heart sounds, intact distal pulses and normal pulses.  Exam reveals no gallop and no friction rub.   No murmur heard. Pulmonary/Chest: Effort normal and breath sounds normal. No tachypnea. No respiratory distress. She has no decreased breath sounds. She has no wheezes. She has no rhonchi. She has no rales.  Abdominal: Soft. Normal appearance and bowel sounds are normal. There is tenderness in the epigastric area.  Neurological: She is alert.  Skin: Skin is warm, dry and intact. No rash noted.  Psychiatric: Her speech is normal and behavior is normal. Judgment and thought content normal. Her mood appears not anxious. Cognition and memory are normal. She does not exhibit a depressed mood.          Assessment & Plan:

## 2015-12-07 ENCOUNTER — Ambulatory Visit (INDEPENDENT_AMBULATORY_CARE_PROVIDER_SITE_OTHER): Payer: BLUE CROSS/BLUE SHIELD | Admitting: Physician Assistant

## 2015-12-07 ENCOUNTER — Encounter: Payer: Self-pay | Admitting: Physician Assistant

## 2015-12-07 VITALS — BP 118/64 | HR 70 | Ht 61.5 in | Wt 125.0 lb

## 2015-12-07 DIAGNOSIS — K5900 Constipation, unspecified: Secondary | ICD-10-CM

## 2015-12-07 DIAGNOSIS — J301 Allergic rhinitis due to pollen: Secondary | ICD-10-CM | POA: Diagnosis not present

## 2015-12-07 DIAGNOSIS — R1013 Epigastric pain: Secondary | ICD-10-CM | POA: Diagnosis not present

## 2015-12-07 DIAGNOSIS — R1310 Dysphagia, unspecified: Secondary | ICD-10-CM | POA: Diagnosis not present

## 2015-12-07 NOTE — Patient Instructions (Addendum)
You have been scheduled for an endoscopy. Please follow written instructions given to you at your visit today. If you use inhalers (even only as needed), please bring them with you on the day of your procedure. Your physician has requested that you go to www.startemmi.com and enter the access code given to you at your visit today. This web site gives a general overview about your procedure. However, you should still follow specific instructions given to you by our office regarding your preparation for the procedure.   Continue Prilosec 40 mg 30-60 mins before breakfast.   Start Miralax daily for constipation.

## 2015-12-07 NOTE — Progress Notes (Signed)
Chief Complaint: Epigastric Pain, Dyspepsia  HPI:  [name] is a 60 year old Caucasian female with past medical history of alcoholism and migraines, who was referred to me by Excell SeltzerBedsole, Amy E, MD for a complaint of epigastric pain and dyspepsia.  The patient previously followed in the clinic with Dr. Juanda ChanceBrodie. Per review of notes her last colonoscopy was performed in 2011, with findings of one diminutive polyp 3 mm in size. This was otherwise normal. It was recommended she have repeat colonoscopy in 10 years for surveillance.   Today, the patient presents to clinic and tells me that she has a chronic epigastric pain. She notes that she has been controlling this with diet and lifestyle modification. She has found that if she stops eating after 5 PM then this will limit the epigastric pain that she feels. She describes that if she does eat after 5 PM she will feel very full even if she only has a small amount of food and feels like this food just "sits in her stomach", she tells me that it feels like she had "20 tons of food" no matter what she eats. At that point she will have a lot of Pepto-Bismol and drinks a lot of Diet Coke in order to help her burp and feel better. She notes that due to this she eats very small meals throughout the day to avoid this sensation. This has been going on for years and she "hasn't thought much about it because she is helping it by not eating after 5".   Most recently, on Sunday night this past week she ate very late, she ate pizza and drank milk and had a piece of cake. She describes that she then woke up on Monday feeling very full "as normal", this continued into Tuesday and she didn't have much to eat that day. That night she started vomiting of bile-like substance she tells me she vomited a total of 5-6 times. This continued into Wednesday when she went to see her primary care provider. She tells me that she thought it was a sinus infection and drainage that she was  vomiting because of a yellow in color. Her primary care provider was concerned regarding her epigastric pain and did labs as below. Labs and ultrasound were normal. The patient continued with symptoms on Wednesday and Thursday as well as an excruciating epigastric pain. She was given Zofran which did not help. She did not take the pain medications as prescribed because she is a "recovering addict". Patient then talked to her niece who is a PA and nephew-in-law in [place], who is a doctor and they told her to start Zantac. She took a Zantac on Saturday and had no further vomiting. She went to her doctor on Monday and was prescribed 40 mg of Prilosec daily as well as Carafate 4 times a day. Patient tells me she has been taking this for the past 5 days and has no further epigastric pain. She does remain somewhat nauseous but has not vomited this week. She tells me that now she has become somewhat constipated and is using the magnesium she usually takes for her migraines in order to help with having a bowel movement.    Along with all this the patient tells me that she is having some dysphagia symptoms. She tells me that when eating specific foods like chips or others she feels as though they get stuck on the way down. This has been occurring for years.  Patient denies  fever, chills, blood in her stool, melena, weight loss, heartburn, reflux or symptoms that awaken her at night.   Past Medical History:  Diagnosis Date  . Alcoholism (HCC)   . Allergy   . ZOXWRUEA(540.9Headache(784.0)     Past Surgical History:  Procedure Laterality Date  . COLPOSCOPY    . NASAL SEPTUM SURGERY    . TONSILLECTOMY      Current Outpatient Prescriptions  Medication Sig Dispense Refill  . b complex vitamins tablet Take 1 tablet by mouth daily.    . calcium carbonate (OS-CAL) 600 MG TABS Take 600 mg by mouth daily.      Marland Kitchen. loratadine (CLARITIN) 10 MG tablet Take 10 mg by mouth as needed.     . Magnesium 500 MG TABS Take 650 mg by  mouth daily.    . Multiple Vitamin (MULTIVITAMIN) tablet Take 1 tablet by mouth daily.      Marland Kitchen. omeprazole (PRILOSEC) 40 MG capsule Take 40 mg by mouth daily.    . predniSONE (DELTASONE) 20 MG tablet 2 tabs x 3 days, 1 tab x 3 days, 1/2 tab x 3 days, 1/2 tab M,W,F x 2 weeks 40 tablet 1  . sucralfate (CARAFATE) 1 GM/10ML suspension Take 10 mLs (1 g total) by mouth 4 (four) times daily -  with meals and at bedtime. 420 mL 0  . SUMAtriptan (IMITREX) 100 MG tablet May repeat in 2 hours if headache persists or recurs. 12 tablet 5  . topiramate (TOPAMAX) 100 MG tablet take 1 tablet by mouth at bedtime 30 tablet 4  . triamcinolone cream (KENALOG) 0.1 % Apply 1 application topically 2 (two) times daily. 30 g 0  . valACYclovir (VALTREX) 500 MG tablet 500 mg twice daily for 3 days for flare ups 30 tablet 1   No current facility-administered medications for this visit.     Allergies as of 12/07/2015 - Review Complete 12/07/2015  Allergen Reaction Noted  . Penicillins      Family History  Problem Relation Age of Onset  . Lung cancer Mother   . Heart attack Father     Social History   Social History  . Marital status: Married    Spouse name: N/A  . Number of children: 0  . Years of education: N/A   Occupational History  . sales office Other   Social History Main Topics  . Smoking status: Never Smoker  . Smokeless tobacco: Never Used  . Alcohol use No  . Drug use: No  . Sexual activity: Not on file   Other Topics Concern  . Not on file   Social History Narrative   Domestic partner(gay) 17 year relationship   Exercise--Walks 3-4 times per week   Diet: lots of sugar, fruits and veggies, +h20             Review of Systems:     Constitutional: No weight loss, fever, chills, weakness or fatigue HEENT: Eyes: No change in vision               Ears, Nose, Throat:  Positive for recent congestion/sinus pressure  Skin: No rash Cardiovascular: No chest pain   Respiratory: No SOB    Gastrointestinal: See HPI and otherwise negative Neurological: Observe for chronic migraines  Musculoskeletal: No new muscle or joint pain Hematologic: No bleeding  Psychiatric: Positive for self-admitted anxiety   Physical Exam:  Vital signs: BP 118/64   Pulse 70   Ht 5' 1.5" (1.562 m)   Wt 125  lb (56.7 kg)   BMI 23.24 kg/m   Constitutional: Caucasian female appears to be in NAD, Well developed, Well nourished, alert and cooperative Head:  Normocephalic and atraumatic. Eyes:   PEERL, EOMI. No icterus. Conjunctiva pink. Ears:  Normal auditory acuity. Neck:  Supple Throat: Oral cavity and pharynx without inflammation, swelling or lesion.  Respiratory: Respirations even and unlabored. Lungs clear to auscultation bilaterally.   No wheezes, crackles, or rhonchi.  Cardiovascular: Normal S1, S2. No MRG. Regular rate and rhythm. No peripheral edema, cyanosis or pallor.  Gastrointestinal:  Soft, nondistended, moderate epigastric tenderness No rebound or guarding. Normal bowel sounds. No appreciable masses or hepatomegaly. Rectal:  Not performed.  Msk:  Symmetrical without gross deformities. Without edema, no deformity or joint abnormality.  Neurologic:  Alert and  oriented x4;  grossly normal neurologically.  Skin:   Dry and intact without significant lesions or rashes. Psychiatric:  Demonstrates good judgement and reason without abnormal affect or behaviors.  MOST RECENT LABS AND IMAGING: CBC    Component Value Date/Time   WBC 7.1 11/28/2015 1209   RBC 4.84 11/28/2015 1209   HGB 13.7 11/28/2015 1209   HCT 41.5 11/28/2015 1209   PLT 207.0 11/28/2015 1209   MCV 85.7 11/28/2015 1209   MCH 28.0 10/07/2013 1648   MCHC 33.0 11/28/2015 1209   RDW 13.6 11/28/2015 1209   LYMPHSABS 2.3 11/28/2015 1209   MONOABS 0.7 11/28/2015 1209   EOSABS 0.4 11/28/2015 1209   BASOSABS 0.0 11/28/2015 1209    CMP     Component Value Date/Time   NA 143 11/28/2015 1209   K 3.7 11/28/2015 1209    CL 109 11/28/2015 1209   CO2 26 11/28/2015 1209   GLUCOSE 89 11/28/2015 1209   BUN 10 11/28/2015 1209   CREATININE 0.88 11/28/2015 1209   CREATININE 0.82 10/07/2013 1648   CALCIUM 9.6 11/28/2015 1209   PROT 6.9 11/28/2015 1209   ALBUMIN 4.3 11/28/2015 1209   AST 17 11/28/2015 1209   ALT 13 11/28/2015 1209   ALKPHOS 60 11/28/2015 1209   BILITOT 0.5 11/28/2015 1209   GFRNONAA 79.45 01/24/2009 1000   GFRAA 84 02/02/2008 1624     Ref Range & Units 9d ago     Lipase 11.0 - 59.0 U/L 15.0      Amylase 27 - 131 U/L 54    US ABDOMEN LIMITED - RIGHT UPPER QUADRANT 11/28/15  COMPARISON:  None.  FINDINGS: Gallbladder:  No gallstones or wall thickening visualized. No sonographic Murphy sign noted by sonographer. Maximal wall thickness is 1.8 mm, within normal limits.  Common bile duct:  Diameter: 2.2 mm, within normal limits  Liver:  Two benign-appearing cysts are identified in the left lobe of the liver measuring 7 and 8 mm respectively. No other focal lesions are present. Liver parenchyma is within normal limits.  IMPRESSION: Negative right upper quadrant ultrasound.   Electronically Signed   By: Marin Roberts M.D.   On: 11/28/2015 15:13   Assessment: 1. Epigastric pain: Chronic per patient, for years has been controlled with lifestyle modification, patient stops eating after 5 PM and has a decrease in symptoms of early satiety/fullness/epigastric pain; consider chronic gastritis versus esophagitis versus H. pylori versus gastroparesis vs other 2. Dyspepsia: Patient describes recent episodes of nausea and vomiting over the past week, better now on Carafate and omeprazole; clearly related to above-does admit to high anxiety in life, could be contributing 3. Dysphagia: Patient describes various foods getting stuck in her throat including  chips; Consider esophageal stricture versus ring versus web 4. Constipation: Acute onset after patient started Carafate,  likely related  Plan: 1. Recommend patient have an EGD for further evaluation of her chronic epigastric pain and recent dyspepsia. Discussed risks, benefits, limitations and alternatives. This is scheduled with Dr. Lavon Paganini as the patient requested a female doctor in the [facility]. 2. Recommend the patient continue her Omeprazole 40 mg daily, 30-60 minutes before breakfast. 3. Discussed with the patient that she could trial titrating off of Carafate as this is likely contributing to her constipation. If she has an increase in epigastric pain or symptoms she can continue this. 4. Recommend patient use MiraLAX up to 4 times a day for acute symptoms of constipation likely related to medication use. 5. Patient does have H. pylori testing ordered in the computer, but will also get definitive biopsies at time of EGD 6. Patient to follow in clinic with Dr. Lavon Paganini per her recommendations after time of procedure.  Hyacinth Meeker, PA-C [facility] 12/07/2015, 2:46 PM  Cc: Excell Seltzer, MD

## 2015-12-10 ENCOUNTER — Telehealth: Payer: Self-pay | Admitting: Physician Assistant

## 2015-12-10 NOTE — Progress Notes (Signed)
Reviewed and agree with documentation and assessment and plan. K. Veena Darneshia Demary , MD   

## 2015-12-10 NOTE — Telephone Encounter (Signed)
Patient had rectal bleeding on Saturday with a hard BM.  She never started the Miralax.  She is advised to start the prilosec again in the am and start Miralax as instructed to soften stool.  She will call back for any additional questions or concerns.

## 2015-12-13 DIAGNOSIS — J301 Allergic rhinitis due to pollen: Secondary | ICD-10-CM | POA: Diagnosis not present

## 2015-12-24 ENCOUNTER — Encounter: Payer: Self-pay | Admitting: Gastroenterology

## 2015-12-26 DIAGNOSIS — J301 Allergic rhinitis due to pollen: Secondary | ICD-10-CM | POA: Diagnosis not present

## 2016-01-02 DIAGNOSIS — J301 Allergic rhinitis due to pollen: Secondary | ICD-10-CM | POA: Diagnosis not present

## 2016-01-03 ENCOUNTER — Ambulatory Visit (AMBULATORY_SURGERY_CENTER): Payer: BLUE CROSS/BLUE SHIELD | Admitting: Gastroenterology

## 2016-01-03 ENCOUNTER — Encounter: Payer: Self-pay | Admitting: Gastroenterology

## 2016-01-03 VITALS — BP 126/60 | HR 61 | Temp 97.5°F | Resp 15 | Ht 61.5 in | Wt 125.0 lb

## 2016-01-03 DIAGNOSIS — K209 Esophagitis, unspecified without bleeding: Secondary | ICD-10-CM

## 2016-01-03 DIAGNOSIS — R1310 Dysphagia, unspecified: Secondary | ICD-10-CM | POA: Diagnosis not present

## 2016-01-03 DIAGNOSIS — K2970 Gastritis, unspecified, without bleeding: Secondary | ICD-10-CM

## 2016-01-03 DIAGNOSIS — K2990 Gastroduodenitis, unspecified, without bleeding: Secondary | ICD-10-CM | POA: Diagnosis not present

## 2016-01-03 MED ORDER — SODIUM CHLORIDE 0.9 % IV SOLN: 500.0000 mL | INTRAVENOUS | Status: AC

## 2016-01-03 NOTE — Progress Notes (Signed)
A/ox3 pleased with MAC, report to Jane rN 

## 2016-01-03 NOTE — Patient Instructions (Signed)
YOU HAD AN ENDOSCOPIC PROCEDURE TODAY AT THE [facility]:   Refer to the procedure report that was given to you for any specific questions about what was found during the examination.  If the procedure report does not answer your questions, please call your gastroenterologist to clarify.  If you requested that your care partner not be given the details of your procedure findings, then the procedure report has been included in a sealed envelope for you to review at your convenience later.  YOU SHOULD EXPECT: Some feelings of bloating in the abdomen. Passage of more gas than usual.  Walking can help get rid of the air that was put into your GI tract during the procedure and reduce the bloating. If you had a lower endoscopy (such as a colonoscopy or flexible sigmoidoscopy) you may notice spotting of blood in your stool or on the toilet paper. If you underwent a bowel prep for your procedure, you may not have a normal bowel movement for a few days.  Please Note:  You might notice some irritation and congestion in your nose or some drainage.  This is from the oxygen used during your procedure.  There is no need for concern and it should clear up in a day or so.  SYMPTOMS TO REPORT IMMEDIATELY:   Following lower endoscopy (colonoscopy or flexible sigmoidoscopy):  Excessive amounts of blood in the stool  Significant tenderness or worsening of abdominal pains  Swelling of the abdomen that is new, acute  Fever of 100F or higher   Following upper endoscopy (EGD)  Vomiting of blood or coffee ground material  New chest pain or pain under the shoulder blades  Painful or persistently difficult swallowing  New shortness of breath  Fever of 100F or higher  Black, tarry-looking stools  For urgent or emergent issues, a gastroenterologist can be reached at any hour by calling [phone].   DIET:  We do recommend a small meal at first, but then you may proceed to your regular diet.  Drink  plenty of fluids but you should avoid alcoholic beverages for 24 hours.  ACTIVITY:  You should plan to take it easy for the rest of today and you should NOT DRIVE or use heavy machinery until tomorrow (because of the sedation medicines used during the test).    FOLLOW UP: Our staff will call the number listed on your records the next business day following your procedure to check on you and address any questions or concerns that you may have regarding the information given to you following your procedure. If we do not reach you, we will leave a message.  However, if you are feeling well and you are not experiencing any problems, there is no need to return our call.  We will assume that you have returned to your regular daily activities without incident.  If any biopsies were taken you will be contacted by phone or by letter within the next 1-3 weeks.  Please call us at [phone] if you have not heard about the biopsies in 3 weeks.    SIGNATURES/CONFIDENTIALITY: You and/or your care partner have signed paperwork which will be entered into your electronic medical record.  These signatures attest to the fact that that the information above on your After Visit Summary has been reviewed and is understood.  Full responsibility of the confidentiality of this discharge information lies with you and/or your care-partner.  Anti reflux information and gastritris information given.

## 2016-01-03 NOTE — Op Note (Signed)
[facility] Patient Name: [name] EpleyMary [name] Procedure Date: 01/03/2016 2:48 PM MRN: [id number] Endoscopist: Napoleon FormKavitha V. Nandigam , MD Age: [age] Referring MD:  Date of Birth: March 14, 1955 Gender: Female Account #: [id number] Procedure:                Upper GI endoscopy Indications:              Dysphagia, New-onset upper abdominal symptoms in                            patient older than 50 years Medicines:                Monitored Anesthesia Care Procedure:                Pre-Anesthesia Assessment:                           - Prior to the procedure, a History and Physical                            was performed, and patient medications and                            allergies were reviewed. The patient's tolerance of                            previous anesthesia was also reviewed. The risks                            and benefits of the procedure and the sedation                            options and risks were discussed with the patient.                            All questions were answered, and informed consent                            was obtained. Prior Anticoagulants: The patient has                            taken no previous anticoagulant or antiplatelet                            agents. ASA Grade Assessment: II - A patient with                            mild systemic disease. After reviewing the risks                            and benefits, the patient was deemed in                            satisfactory condition to undergo the procedure.  After obtaining informed consent, the endoscope was                            passed under direct vision. Throughout the                            procedure, the patient's blood pressure, pulse, and                            oxygen saturations were monitored continuously. The                            Model GIF-[id number] scope was introduced                            through the mouth, and  advanced to the second part                            of duodenum. The upper GI endoscopy was                            accomplished without difficulty. The patient                            tolerated the procedure well. Scope In: Scope Out: Findings:                 LA Grade A (one or more mucosal breaks less than 5                            mm, not extending between tops of 2 mucosal folds)                            esophagitis with no bleeding was found 33 to 35 cm                            from the incisors. Regular Z-line at 35cm. No                            evidence of esophageal ring or stricture                           Diffuse mild inflammation characterized by                            congestion (edema), erythema and friability was                            found in the entire examined stomach. Biopsies were                            taken with a cold forceps for Helicobacter pylori  testing using CLOtest.                           The examined duodenum was normal. Complications:            No immediate complications. Estimated Blood Loss:     Estimated blood loss was minimal. Impression:               - LA Grade A reflux esophagitis.                           - Gastritis. Biopsied.                           - Normal examined duodenum. Recommendation:           - Patient has a contact number available for                            emergencies. The signs and symptoms of potential                            delayed complications were discussed with the                            patient. Return to normal activities tomorrow.                            Written discharge instructions were provided to the                            patient.                           - Resume previous diet.                           - Continue present medications.                           -Continue Omeprazole 40mg  daily, 30 minutes before                             breakfast                           - Small frequent meals                           - Await pathology results.                           - No repeat upper endoscopy.                           - Follow an antireflux regimen indefinitely. No                            meals 3 hours prior to  laying down and sleep with                            head end elevation Napoleon FormKavitha V. Nandigam, MD 01/03/2016 3:08:33 PM This report has been signed electronically.

## 2016-01-03 NOTE — Progress Notes (Signed)
Dental advisory given to patient 

## 2016-01-03 NOTE — Progress Notes (Signed)
Called to room to assist during endoscopic procedure.  Patient ID and intended procedure confirmed with present staff. Received instructions for my participation in the procedure from the performing physician.  

## 2016-01-04 ENCOUNTER — Telehealth: Payer: Self-pay | Admitting: *Deleted

## 2016-01-04 LAB — HELICOBACTER PYLORI SCREEN-BIOPSY: UREASE: NEGATIVE

## 2016-01-04 NOTE — Telephone Encounter (Signed)
  Follow up Call-  Call back number 01/03/2016  Post procedure Call Back phone  # [phone]  Permission to leave phone message Yes  Some recent data might be hidden     Patient questions:  Do you have a fever, pain , or abdominal swelling? No. Pain Score  0 *  Have you tolerated food without any problems? Yes.    Have you been able to return to your normal activities? Yes.    Do you have any questions about your discharge instructions: Diet   No. Medications  No. Follow up visit  No.  Do you have questions or concerns about your Care? No.  Actions: * If pain score is 4 or above: No action needed, pain <4.

## 2016-01-17 DIAGNOSIS — J301 Allergic rhinitis due to pollen: Secondary | ICD-10-CM | POA: Diagnosis not present

## 2016-01-26 NOTE — Assessment & Plan Note (Signed)
Eval with labs. Start prilosec/nexium 40 mg daily ( 2 x 20 mg)   Use carafate as needed for epigastric pain.

## 2016-04-09 ENCOUNTER — Other Ambulatory Visit: Payer: Self-pay | Admitting: Family Medicine

## 2016-05-19 ENCOUNTER — Other Ambulatory Visit: Payer: Self-pay | Admitting: Family Medicine

## 2016-06-03 ENCOUNTER — Encounter: Payer: Self-pay | Admitting: Family Medicine

## 2016-06-03 ENCOUNTER — Ambulatory Visit (INDEPENDENT_AMBULATORY_CARE_PROVIDER_SITE_OTHER): Payer: Managed Care, Other (non HMO) | Admitting: Family Medicine

## 2016-06-03 VITALS — BP 110/60 | HR 64 | Temp 97.4°F | Ht 61.5 in | Wt 127.5 lb

## 2016-06-03 DIAGNOSIS — G43709 Chronic migraine without aura, not intractable, without status migrainosus: Secondary | ICD-10-CM | POA: Diagnosis not present

## 2016-06-03 DIAGNOSIS — J309 Allergic rhinitis, unspecified: Secondary | ICD-10-CM | POA: Diagnosis not present

## 2016-06-03 DIAGNOSIS — I519 Heart disease, unspecified: Secondary | ICD-10-CM | POA: Diagnosis not present

## 2016-06-03 DIAGNOSIS — Z0000 Encounter for general adult medical examination without abnormal findings: Secondary | ICD-10-CM | POA: Diagnosis not present

## 2016-06-03 DIAGNOSIS — Z23 Encounter for immunization: Secondary | ICD-10-CM

## 2016-06-03 DIAGNOSIS — IMO0002 Reserved for concepts with insufficient information to code with codable children: Secondary | ICD-10-CM

## 2016-06-03 DIAGNOSIS — K209 Esophagitis, unspecified without bleeding: Secondary | ICD-10-CM

## 2016-06-03 DIAGNOSIS — I5189 Other ill-defined heart diseases: Secondary | ICD-10-CM

## 2016-06-03 MED ORDER — TOPIRAMATE 100 MG PO TABS: 100.0000 mg | ORAL_TABLET | Freq: Every day | ORAL | 3 refills | Status: DC

## 2016-06-03 NOTE — Assessment & Plan Note (Signed)
Resolved symptoms. Decrease prilosec to 20 mg daily if able.

## 2016-06-03 NOTE — Patient Instructions (Addendum)
Look into shingles vaccine.  Schedule fasting labs only for cholesterol if interested.  keep working on healthy eating and regular exercise.  Try to wean down to 20 mg prilosec daily.   Healthy Eating to Prevent Digestive Disorders The digestive system starts at the mouth and goes all the way down to the rectum. Along the way, your digestive system breaks down the food you eat so you can absorb its nutrients and use them for energy. Digestive disorders can cause gas, bloating, pain, heartburn, and other symptoms. They can prevent your digestive system from doing its job. Healthy eating and a healthy lifestyle can help you avoid many common digestive disorders. What nutrition changes can be made? Start by eating a balanced diet. Eat healthy foods from all the major food groups. These include carbohydrates, fats, and proteins. Other changes you can make include to:  Eat enough fiber. Fiber is a healthy carbohydrate that cleans out your digestive system. Fiber absorbs water and helps you have regular bowel movements. Fiber comes from plants. To get enough fiber in your diet, eat 4-5 servings of fruits, vegetables, and legumes every day. Include beans and whole grains. Most people should get 20?35 grams of fiber each day.  Drink enough water to keep your urine clear or pale yellow. Water helps your body digest food. It can also help prevent constipation.  Avoid fatty proteins. Full-fat dairy products and fatty meats are hard to digest. Fats you want to avoid are those that get solid at room temperature (saturated fats). Instead of eating these kinds of fats, eat plant-based unsaturated fats found in olives, canola, corn, avocado, and nuts.  If you have trouble with gas, belching, or flatulence, avoid gas-producing foods. These include beans, carbonated beverages, cabbage, cauliflower, and broccoli. If you are lactose intolerant, avoid dairy products or choose lactose-free dairy products.  If you  have frequent heartburn, stay away from alcohol, caffeine, fatty foods, chocolate, and peppermint. Avoid lying down within two hours of eating a full meal. Overeating and lying down too soon after a meal can cause heartburn.  Add probiotics to your diet. Healthy digestion depends on having the right balance of good bacteria in your colon. Probiotics can help restore the balance of good bacteria in your digestive system. Probiotics are live active cultures that are found in yogurt, kefir, and cultured foods like sauerkraut and miso. You can also add good bacteria with probiotic supplements.  Make sure to chew your food slowly and completely.  Instead of eating three large meals each day, eat three small meals with three small snacks. What other changes can I make? You can help your digestive system stay healthy by making these lifestyle changes:  Stay active and exercise every day.  Maintain a healthy weight.  Eat on a regular schedule.  Avoid tight-fitting clothes. They can restrict digestion.  If you have frequent heartburn, raise the head of your bed 2-3 inches (5-7.5 cm).  Do not use any tobacco products, such as cigarettes, chewing tobacco, and e-cigarettes. If you need help quitting, ask your health care provider.  Limit alcohol intake to no more than 1 drink a day for nonpregnant women and 2 drinks a day for men. One drink equals 12 oz of beer, 5 oz of wine, or 1 oz of hard liquor.  Avoid stress. Find ways to reduce stress, such as meditation, exercise, or taking time for activities that relax you. Why should I make these changes? Making these changes will help your digestive  system function at its best. A healthy digestive system can help you avoid or improve your management of digestive disorders such as:  Bloating, gas, and flatulence.  Heartburn.  Gastroesophageal reflux disease (GERD).  Peptic ulcer  disease.  Hemorrhoids.  Diverticulitis.  Constipation.  Diarrhea.  Gall stones  Irritable bowel syndrome.  Malnutrition.  Fatty liver disease. What can happen if changes are not made? Not making these changes could put you at risk for many conditions caused by a poor diet or an unhealthy weight, such as heart disease, stroke and diabetes. Where can I get more information? Learn more about healthy eating and digestive disorders by visiting these websites:  Academy of Nutrition and Dietetics: SplashPops.cahttp://www.eatright.org/resources/health/wellness/digestive-health  Centers for Disease Control and Prevention: TanClothes.com.cyhttps://health.gov/dietaryguidelines/2015/  U.S. Department of Health and Human Services: CosmeticsCritic.sihttps://www.womenshealth.gov/files/assets/docs/the-healthy-woman/digestive_health.pdf Summary  A heathy diet can help prevent many digestive disorders.  Eat a balanced diet consisting of fiber, unsaturated fats, lean protein, fruits, and vegetables.  Eat three small meals with three small snacks per day.  Drink plenty of water every day.  Get plenty of exercise and maintain a healthy weight. This information is not intended to replace advice given to you by your health care provider. Make sure you discuss any questions you have with your health care provider. Document Released: 01/19/2015 Document Revised: 05/31/2015 Document Reviewed: 09/05/2015 Elsevier Interactive Patient Education  2017 [employer]

## 2016-06-03 NOTE — Progress Notes (Signed)
Subjective:    Patient ID: [name] [name], female    DOB: [birth year], 61 y.o.   MRN: [id number]  HPI   60 year old female presents for follow up chronic migraine. She is currently on topamax 100 mg daily   Wt Readings from Last 3 Encounters:  06/03/16 127 lb 8 oz (57.8 kg)  01/03/16 125 lb (56.7 kg)  12/07/15 125 lb (56.7 kg)   Frequency: once every 3 months Prophylactic: topamax Trigger: stress at work Acute treatment: sumatriptan 100 mg   med SE: none  Social History /Family History/Past Medical History reviewed in detail and updated in EMR if needed.  Blood pressure 110/60, pulse 64, temperature 97.4 F (36.3 C), temperature source Oral, height 5' 1.5" (1.562 m), weight 127 lb 8 oz (57.8 kg).  Review of Systems  Constitutional: Negative for fatigue and fever.  HENT: Negative for ear pain.   Eyes: Negative for pain.  Respiratory: Negative for chest tightness and shortness of breath.   Cardiovascular: Negative for chest pain, palpitations and leg swelling.  Gastrointestinal: Negative for abdominal pain.  Genitourinary: Negative for dysuria.       Objective:   Physical Exam  Constitutional: Vital signs are normal. She appears well-developed and well-nourished. She is cooperative.  Non-toxic appearance. She does not appear ill. No distress.  HENT:  Head: Normocephalic.  Right Ear: Hearing, tympanic membrane, external ear and ear canal normal.  Left Ear: Hearing, tympanic membrane, external ear and ear canal normal.  Nose: Nose normal.  Eyes: Conjunctivae, EOM and lids are normal. Pupils are equal, round, and reactive to light. Lids are everted and swept, no foreign bodies found.  Neck: Trachea normal and normal range of motion. Neck supple. Carotid bruit is not present. No thyroid mass and no thyromegaly present.  Cardiovascular: Normal rate, regular rhythm, S1 normal, S2 normal, normal heart sounds and intact distal pulses.  Exam reveals no gallop.   No murmur  heard. Pulmonary/Chest: Effort normal and breath sounds normal. No respiratory distress. She has no wheezes. She has no rhonchi. She has no rales.  Abdominal: Soft. Normal appearance and bowel sounds are normal. She exhibits no distension, no fluid wave, no abdominal bruit and no mass. There is no hepatosplenomegaly. There is no tenderness. There is no rebound, no guarding and no CVA tenderness. No hernia.  Lymphadenopathy:    She has no cervical adenopathy.    She has no axillary adenopathy.  Neurological: She is alert. She has normal strength. No cranial nerve deficit or sensory deficit.  Skin: Skin is warm, dry and intact. No rash noted.  Psychiatric: Her speech is normal and behavior is normal. Judgment normal. Her mood appears not anxious. Cognition and memory are normal. She does not exhibit a depressed mood.          Assessment & Plan:  The patient's preventative maintenance and recommended screening tests for an annual wellness exam were reviewed in full today. Brought up to date unless services declined.  Counselled on the importance of diet, exercise, and its role in overall health and mortality. The patient's FH and SH was reviewed, including their home life, tobacco status, and drug and alcohol status.   Vaccines: Due for Tdap, flu refused, will consider shingles.  Mammo: last nml 10/2015 PAP/DVE: last pap nml, low risk HPV 2016, on every 5 year schedule pap, dve q2 years Colon: 03/2009 small polyp, repeat in 10 years.  Nonsmoker  Refuses HIV. Hep C screen: neg DEXA: 04/2014. Osteopenia,  repeat in 2-5 years. Family hx of osteoporosis and frequent pred use.

## 2016-06-03 NOTE — Addendum Note (Signed)
Addended by: Damita LackLORING, Aniyiah Zell S on: 06/03/2016 02:02 PM   Modules accepted: Orders

## 2016-06-03 NOTE — Assessment & Plan Note (Signed)
Good blood pressure control. Encouraged exercise, weight loss, healthy eating habits.

## 2016-06-03 NOTE — Assessment & Plan Note (Signed)
Continue claritin and allergy shots.

## 2016-06-03 NOTE — Assessment & Plan Note (Signed)
Continue topamax for prevention.

## 2016-06-10 ENCOUNTER — Other Ambulatory Visit (INDEPENDENT_AMBULATORY_CARE_PROVIDER_SITE_OTHER): Payer: Managed Care, Other (non HMO)

## 2016-06-10 DIAGNOSIS — Z13220 Encounter for screening for lipoid disorders: Secondary | ICD-10-CM | POA: Diagnosis not present

## 2016-06-10 LAB — LIPID PANEL
CHOL/HDL RATIO: 3
Cholesterol: 218 mg/dL — ABNORMAL HIGH (ref 0–200)
HDL: 65.8 mg/dL (ref >39.00)
LDL CALC: 127 mg/dL — AB (ref 0–99)
NONHDL: 151.9
Triglycerides: 126 mg/dL (ref 0.0–149.0)
VLDL: 25.2 mg/dL (ref 0.0–40.0)

## 2016-08-28 ENCOUNTER — Other Ambulatory Visit: Payer: Self-pay | Admitting: Family Medicine

## 2016-10-30 ENCOUNTER — Other Ambulatory Visit: Payer: Self-pay | Admitting: Family Medicine

## 2016-10-30 DIAGNOSIS — Z1231 Encounter for screening mammogram for malignant neoplasm of breast: Secondary | ICD-10-CM

## 2016-12-19 ENCOUNTER — Ambulatory Visit
Admission: RE | Admit: 2016-12-19 | Discharge: 2016-12-19 | Disposition: A | Discharge status: Home / Self Care | Payer: Managed Care, Other (non HMO) | Source: Ambulatory Visit | Attending: Family Medicine | Admitting: Family Medicine

## 2016-12-19 DIAGNOSIS — Z1231 Encounter for screening mammogram for malignant neoplasm of breast: Secondary | ICD-10-CM

## 2017-01-15 DIAGNOSIS — J301 Allergic rhinitis due to pollen: Secondary | ICD-10-CM | POA: Diagnosis not present

## 2017-01-26 ENCOUNTER — Encounter: Payer: Self-pay | Admitting: Internal Medicine

## 2017-01-26 ENCOUNTER — Ambulatory Visit: Payer: BLUE CROSS/BLUE SHIELD | Admitting: Internal Medicine

## 2017-01-26 VITALS — BP 122/68 | HR 68 | Temp 97.8°F | Resp 18 | Wt 125.0 lb

## 2017-01-26 DIAGNOSIS — R319 Hematuria, unspecified: Secondary | ICD-10-CM | POA: Diagnosis not present

## 2017-01-26 DIAGNOSIS — R300 Dysuria: Secondary | ICD-10-CM | POA: Diagnosis not present

## 2017-01-26 DIAGNOSIS — R3915 Urgency of urination: Secondary | ICD-10-CM | POA: Diagnosis not present

## 2017-01-26 LAB — POC URINALSYSI DIPSTICK (AUTOMATED)
Bilirubin, UA: NEGATIVE
Glucose, UA: NEGATIVE
Ketones, UA: NEGATIVE
LEUKOCYTES UA: NEGATIVE
Nitrite, UA: NEGATIVE
PH UA: 6 (ref 5.0–8.0)
PROTEIN UA: NEGATIVE
Spec Grav, UA: 1.01 (ref 1.010–1.025)
UROBILINOGEN UA: 0.2 U/dL

## 2017-01-26 MED ORDER — NITROFURANTOIN MONOHYD MACRO 100 MG PO CAPS: 100.0000 mg | ORAL_CAPSULE | Freq: Two times a day (BID) | ORAL | 0 refills | Status: DC

## 2017-01-26 NOTE — Progress Notes (Signed)
HPI  Pt presents to the clinic today with c/o urinary urgency, dysuria and blood in her urine. She reports this started 2-3 days ago. She denies fever, nausea or low back pain, but has had some chills. She has not taken anything OTC for her symptoms. She denies vaginal complaints. She has no history of kidney stones.   Review of Systems  Past Medical History:  Diagnosis Date  . Alcoholism (HCC)   . Allergy   . Headache(784.0)     Family History  Problem Relation Age of Onset  . Lung cancer Mother   . Heart attack Father     Social History   Socioeconomic History  . Marital status: Married    Spouse name: Not on file  . Number of children: 0  . Years of education: Not on file  . Highest education level: Not on file  Social Needs  . Financial resource strain: Not on file  . Food insecurity - worry: Not on file  . Food insecurity - inability: Not on file  . Transportation needs - medical: Not on file  . Transportation needs - non-medical: Not on file  Occupational History  . 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 Smoking status: Never Smoker  . Smokeless tobacco: Never Used  Substance and Sexual Activity  . Alcohol use: No    Alcohol/week: 0.0 oz  . Drug use: No  . Sexual activity: Not on file  Other Topics Concern  . Not on file  Social History Narrative   Domestic partner(gay) 17 year relationship   Exercise--Walks 3-4 times per week   Diet: lots of sugar, fruits and veggies, +h20             Allergies  Allergen Reactions  . Penicillins     REACTION: Rash     Constitutional: Denies fever, malaise, fatigue, headache or abrupt weight changes.   GU: Pt reports urgency, dysuria, and blood in her urine. Denies frequency, odor or discharge.   No other specific complaints in a complete review of systems (except as listed in HPI above).    Objective:   Physical Exam  BP 122/68 (BP Location: Left Arm, Patient Position: Sitting)    Pulse 68   Temp 97.8 F (36.6 C) (Oral)   Resp 18   Wt 125 lb (56.7 kg)   SpO2 97%   BMI 23.24 kg/m  Wt Readings from Last 3 Encounters:  01/26/17 125 lb (56.7 kg)  06/03/16 127 lb 8 oz (57.8 kg)  01/03/16 125 lb (56.7 kg)    General: Appears her stated age, well developed, well nourished in NAD. Abdomen: Soft. Normal bowel sounds. No distention or masses noted.  Tender to palpation over the bladder area. No CVA tenderness.       Assessment & Plan:   Urgency, Dysuria and Blood in Urine:  Urinalysis: 2+ blood Will send urine culture eRx sent if for Macrobid 100 mg BID x 5 days OK to take AZO OTC Drink plenty of fluids  RTC as needed or if symptoms persist. Nicki ReaperBAITY, Calianna Kim, NP

## 2017-01-26 NOTE — Patient Instructions (Signed)

## 2017-01-28 LAB — URINE CULTURE
MICRO NUMBER: 90085542
SPECIMEN QUALITY:: ADEQUATE

## 2017-01-29 DIAGNOSIS — J301 Allergic rhinitis due to pollen: Secondary | ICD-10-CM | POA: Diagnosis not present

## 2017-02-06 DIAGNOSIS — J301 Allergic rhinitis due to pollen: Secondary | ICD-10-CM | POA: Diagnosis not present

## 2017-02-09 DIAGNOSIS — J301 Allergic rhinitis due to pollen: Secondary | ICD-10-CM | POA: Diagnosis not present

## 2017-02-19 DIAGNOSIS — J301 Allergic rhinitis due to pollen: Secondary | ICD-10-CM | POA: Diagnosis not present

## 2017-02-25 DIAGNOSIS — J301 Allergic rhinitis due to pollen: Secondary | ICD-10-CM | POA: Diagnosis not present

## 2017-03-04 ENCOUNTER — Other Ambulatory Visit: Payer: Self-pay | Admitting: Family Medicine

## 2017-03-05 DIAGNOSIS — J301 Allergic rhinitis due to pollen: Secondary | ICD-10-CM | POA: Diagnosis not present

## 2017-03-12 DIAGNOSIS — J301 Allergic rhinitis due to pollen: Secondary | ICD-10-CM | POA: Diagnosis not present

## 2017-03-19 DIAGNOSIS — J301 Allergic rhinitis due to pollen: Secondary | ICD-10-CM | POA: Diagnosis not present

## 2017-04-02 DIAGNOSIS — J301 Allergic rhinitis due to pollen: Secondary | ICD-10-CM | POA: Diagnosis not present

## 2017-04-09 DIAGNOSIS — J301 Allergic rhinitis due to pollen: Secondary | ICD-10-CM | POA: Diagnosis not present

## 2017-04-16 DIAGNOSIS — J301 Allergic rhinitis due to pollen: Secondary | ICD-10-CM | POA: Diagnosis not present

## 2017-04-23 DIAGNOSIS — J301 Allergic rhinitis due to pollen: Secondary | ICD-10-CM | POA: Diagnosis not present

## 2017-04-30 ENCOUNTER — Other Ambulatory Visit: Payer: Self-pay | Admitting: Family Medicine

## 2017-05-01 DIAGNOSIS — J301 Allergic rhinitis due to pollen: Secondary | ICD-10-CM | POA: Diagnosis not present

## 2017-05-04 DIAGNOSIS — J301 Allergic rhinitis due to pollen: Secondary | ICD-10-CM | POA: Diagnosis not present

## 2017-05-18 DIAGNOSIS — J301 Allergic rhinitis due to pollen: Secondary | ICD-10-CM | POA: Diagnosis not present

## 2017-05-27 DIAGNOSIS — J301 Allergic rhinitis due to pollen: Secondary | ICD-10-CM | POA: Diagnosis not present

## 2017-05-29 ENCOUNTER — Other Ambulatory Visit: Payer: Self-pay | Admitting: Family Medicine

## 2017-06-04 DIAGNOSIS — J301 Allergic rhinitis due to pollen: Secondary | ICD-10-CM | POA: Diagnosis not present

## 2017-06-05 ENCOUNTER — Ambulatory Visit: Payer: BLUE CROSS/BLUE SHIELD | Admitting: Family Medicine

## 2017-06-05 ENCOUNTER — Encounter: Payer: Self-pay | Admitting: Family Medicine

## 2017-06-05 ENCOUNTER — Other Ambulatory Visit: Payer: Self-pay

## 2017-06-05 ENCOUNTER — Other Ambulatory Visit: Payer: Self-pay | Admitting: *Deleted

## 2017-06-05 VITALS — BP 100/60 | HR 52 | Temp 97.7°F | Ht 61.25 in | Wt 123.0 lb

## 2017-06-05 DIAGNOSIS — G43709 Chronic migraine without aura, not intractable, without status migrainosus: Secondary | ICD-10-CM

## 2017-06-05 DIAGNOSIS — IMO0002 Reserved for concepts with insufficient information to code with codable children: Secondary | ICD-10-CM

## 2017-06-05 MED ORDER — VALACYCLOVIR HCL 500 MG PO TABS: ORAL_TABLET | ORAL | 1 refills | Status: DC

## 2017-06-05 NOTE — Assessment & Plan Note (Signed)
Some worsening of control but pt feels stress will improve in next 3-6 months. She wishes to continue on current dose of topiramate.  follow up when coming in for CPX in 2 months.  Topiramate may be causing leg cramps.

## 2017-06-05 NOTE — Patient Instructions (Addendum)
Continue topiramate 100 gm at bedtime. Use sumatriptan as needed for acute migraine.  Call for adjustment of topiramate if migraine frequency is worsening further. Stress reduction, regular exercise, push fluids, don't skip meals.

## 2017-06-05 NOTE — Progress Notes (Signed)
Subjective:    Patient ID: [name] [name], female    DOB: [birth year], 62 y.o.   MRN: [id number]  HPI    62 year old female presents for follow up on migraine   She reports she has had some worsening of migraine. Now occurring once a week. Using imitrex with good relief once a week. .  She is under more stress given layoffs at work. Working 12-14 hours a day.  Worrying. Not sleeping well given dog is sick   She is keeping up with exercise and water intake.  She is using topiramate 100 mg daily at bedtime for prevention. Imitrex generic for acute migraine.  Blood pressure 100/60, pulse (!) 52, temperature 97.7 F (36.5 C), temperature source Oral, height 5' 1.25" (1.556 m), weight 123 lb (55.8 kg).   Review of Systems  Constitutional: Negative for fatigue and fever.  HENT: Negative for congestion.   Eyes: Negative for pain.  Respiratory: Negative for cough and shortness of breath.   Cardiovascular: Negative for chest pain, palpitations and leg swelling.  Gastrointestinal: Negative for abdominal pain.  Genitourinary: Negative for dysuria and vaginal bleeding.  Musculoskeletal: Negative for back pain.  Neurological: Positive for headaches. Negative for syncope and light-headedness.  Psychiatric/Behavioral: Negative for dysphoric mood.       Objective:   Physical Exam  Constitutional: She is oriented to person, place, and time. Vital signs are normal. She appears well-developed and well-nourished. She is cooperative.  Non-toxic appearance. She does not appear ill. No distress.  HENT:  Head: Normocephalic.  Right Ear: Hearing, tympanic membrane, external ear and ear canal normal. Tympanic membrane is not erythematous, not retracted and not bulging.  Left Ear: Hearing, tympanic membrane, external ear and ear canal normal. Tympanic membrane is not erythematous, not retracted and not bulging.  Nose: No mucosal edema or rhinorrhea. Right sinus exhibits no maxillary sinus  tenderness and no frontal sinus tenderness. Left sinus exhibits no maxillary sinus tenderness and no frontal sinus tenderness.  Mouth/Throat: Uvula is midline, oropharynx is clear and moist and mucous membranes are normal.  Eyes: Pupils are equal, round, and reactive to light. Conjunctivae, EOM and lids are normal. Lids are everted and swept, no foreign bodies found.  Neck: Trachea normal and normal range of motion. Neck supple. Carotid bruit is not present. No thyroid mass and no thyromegaly present.  Cardiovascular: Normal rate, regular rhythm, S1 normal, S2 normal, normal heart sounds, intact distal pulses and normal pulses. Exam reveals no gallop and no friction rub.  No murmur heard. Pulmonary/Chest: Effort normal and breath sounds normal. No tachypnea. No respiratory distress. She has no decreased breath sounds. She has no wheezes. She has no rhonchi. She has no rales.  Abdominal: Soft. Normal appearance and bowel sounds are normal. There is no tenderness.  Neurological: She is alert and oriented to person, place, and time. She has normal strength and normal reflexes. No cranial nerve deficit or sensory deficit. She exhibits normal muscle tone. She displays a negative Romberg sign. Coordination and gait normal. GCS eye subscore is 4. GCS verbal subscore is 5. GCS motor subscore is 6.  Nml cerebellar exam   No papilledema  Skin: Skin is warm, dry and intact. No rash noted.  Psychiatric: She has a normal mood and affect. Her speech is normal and behavior is normal. Judgment and thought content normal. Her mood appears not anxious. Cognition and memory are normal. Cognition and memory are not impaired. She does not exhibit a depressed mood.  She exhibits normal recent memory and normal remote memory.          Assessment & Plan:

## 2017-06-15 DIAGNOSIS — J301 Allergic rhinitis due to pollen: Secondary | ICD-10-CM | POA: Diagnosis not present

## 2017-06-22 DIAGNOSIS — J301 Allergic rhinitis due to pollen: Secondary | ICD-10-CM | POA: Diagnosis not present

## 2017-06-29 DIAGNOSIS — J301 Allergic rhinitis due to pollen: Secondary | ICD-10-CM | POA: Diagnosis not present

## 2017-07-06 DIAGNOSIS — J301 Allergic rhinitis due to pollen: Secondary | ICD-10-CM | POA: Diagnosis not present

## 2017-07-23 DIAGNOSIS — J301 Allergic rhinitis due to pollen: Secondary | ICD-10-CM | POA: Diagnosis not present

## 2017-07-24 DIAGNOSIS — J301 Allergic rhinitis due to pollen: Secondary | ICD-10-CM | POA: Diagnosis not present

## 2017-07-28 ENCOUNTER — Encounter (INDEPENDENT_AMBULATORY_CARE_PROVIDER_SITE_OTHER): Payer: Self-pay

## 2017-07-30 ENCOUNTER — Telehealth: Payer: Self-pay | Admitting: Family Medicine

## 2017-07-30 ENCOUNTER — Other Ambulatory Visit: Payer: BLUE CROSS/BLUE SHIELD

## 2017-07-30 DIAGNOSIS — Z13220 Encounter for screening for lipoid disorders: Secondary | ICD-10-CM

## 2017-07-30 DIAGNOSIS — J301 Allergic rhinitis due to pollen: Secondary | ICD-10-CM | POA: Diagnosis not present

## 2017-07-30 DIAGNOSIS — M8580 Other specified disorders of bone density and structure, unspecified site: Secondary | ICD-10-CM

## 2017-07-30 NOTE — Telephone Encounter (Signed)
-----   Message from Alvina Chouerri J Walsh sent at 07/21/2017  3:06 PM EDT ----- Regarding: Lab orders for Thursday, 7.25.19 Patient is scheduled for CPX labs, please order future labs, Thanks , Camelia Engerri

## 2017-08-05 DIAGNOSIS — J301 Allergic rhinitis due to pollen: Secondary | ICD-10-CM | POA: Diagnosis not present

## 2017-08-07 ENCOUNTER — Encounter: Payer: BLUE CROSS/BLUE SHIELD | Admitting: Family Medicine

## 2017-08-19 DIAGNOSIS — J301 Allergic rhinitis due to pollen: Secondary | ICD-10-CM | POA: Diagnosis not present

## 2017-08-23 ENCOUNTER — Other Ambulatory Visit: Payer: Self-pay | Admitting: Family Medicine

## 2017-08-27 DIAGNOSIS — J301 Allergic rhinitis due to pollen: Secondary | ICD-10-CM | POA: Diagnosis not present

## 2017-09-03 DIAGNOSIS — J301 Allergic rhinitis due to pollen: Secondary | ICD-10-CM | POA: Diagnosis not present

## 2017-09-10 DIAGNOSIS — J301 Allergic rhinitis due to pollen: Secondary | ICD-10-CM | POA: Diagnosis not present

## 2017-09-17 DIAGNOSIS — J301 Allergic rhinitis due to pollen: Secondary | ICD-10-CM | POA: Diagnosis not present

## 2017-09-24 DIAGNOSIS — J301 Allergic rhinitis due to pollen: Secondary | ICD-10-CM | POA: Diagnosis not present

## 2017-10-01 DIAGNOSIS — J301 Allergic rhinitis due to pollen: Secondary | ICD-10-CM | POA: Diagnosis not present

## 2017-10-02 ENCOUNTER — Other Ambulatory Visit (INDEPENDENT_AMBULATORY_CARE_PROVIDER_SITE_OTHER): Payer: BLUE CROSS/BLUE SHIELD

## 2017-10-02 DIAGNOSIS — M8580 Other specified disorders of bone density and structure, unspecified site: Secondary | ICD-10-CM | POA: Diagnosis not present

## 2017-10-02 DIAGNOSIS — Z13220 Encounter for screening for lipoid disorders: Secondary | ICD-10-CM | POA: Diagnosis not present

## 2017-10-02 LAB — COMPREHENSIVE METABOLIC PANEL
ALT: 13 U/L (ref 0–35)
AST: 20 U/L (ref 0–37)
Albumin: 4.4 g/dL (ref 3.5–5.2)
Alkaline Phosphatase: 57 U/L (ref 39–117)
BUN: 14 mg/dL (ref 6–23)
CHLORIDE: 108 meq/L (ref 96–112)
CO2: 27 mEq/L (ref 19–32)
Calcium: 9.8 mg/dL (ref 8.4–10.5)
Creatinine, Ser: 0.99 mg/dL (ref 0.40–1.20)
GFR: 60.28 mL/min (ref >60.00)
GLUCOSE: 85 mg/dL (ref 70–99)
POTASSIUM: 4.4 meq/L (ref 3.5–5.1)
SODIUM: 140 meq/L (ref 135–145)
Total Bilirubin: 0.5 mg/dL (ref 0.2–1.2)
Total Protein: 7 g/dL (ref 6.0–8.3)

## 2017-10-02 LAB — LIPID PANEL
CHOLESTEROL: 217 mg/dL — AB (ref 0–200)
HDL: 67 mg/dL (ref >39.00)
LDL CALC: 129 mg/dL — AB (ref 0–99)
NONHDL: 150.04
Total CHOL/HDL Ratio: 3
Triglycerides: 107 mg/dL (ref 0.0–149.0)
VLDL: 21.4 mg/dL (ref 0.0–40.0)

## 2017-10-02 LAB — VITAMIN D 25 HYDROXY (VIT D DEFICIENCY, FRACTURES): VITD: 41.11 ng/mL (ref 30.00–100.00)

## 2017-10-15 DIAGNOSIS — J301 Allergic rhinitis due to pollen: Secondary | ICD-10-CM | POA: Diagnosis not present

## 2017-10-16 ENCOUNTER — Encounter: Payer: Self-pay | Admitting: Family Medicine

## 2017-10-16 ENCOUNTER — Ambulatory Visit (INDEPENDENT_AMBULATORY_CARE_PROVIDER_SITE_OTHER): Payer: BLUE CROSS/BLUE SHIELD | Admitting: Family Medicine

## 2017-10-16 VITALS — BP 112/65 | HR 53 | Temp 97.8°F | Ht 61.5 in | Wt 123.5 lb

## 2017-10-16 DIAGNOSIS — IMO0002 Reserved for concepts with insufficient information to code with codable children: Secondary | ICD-10-CM

## 2017-10-16 DIAGNOSIS — J301 Allergic rhinitis due to pollen: Secondary | ICD-10-CM | POA: Diagnosis not present

## 2017-10-16 DIAGNOSIS — I519 Heart disease, unspecified: Secondary | ICD-10-CM

## 2017-10-16 DIAGNOSIS — Z23 Encounter for immunization: Secondary | ICD-10-CM | POA: Diagnosis not present

## 2017-10-16 DIAGNOSIS — Z0000 Encounter for general adult medical examination without abnormal findings: Secondary | ICD-10-CM | POA: Diagnosis not present

## 2017-10-16 DIAGNOSIS — K921 Melena: Secondary | ICD-10-CM | POA: Insufficient documentation

## 2017-10-16 DIAGNOSIS — G43709 Chronic migraine without aura, not intractable, without status migrainosus: Secondary | ICD-10-CM

## 2017-10-16 DIAGNOSIS — I5189 Other ill-defined heart diseases: Secondary | ICD-10-CM

## 2017-10-16 NOTE — Assessment & Plan Note (Signed)
Blood pressure well controlled

## 2017-10-16 NOTE — Assessment & Plan Note (Signed)
Anoscopy showed  Ext hemorrhoids, small and no irritated... Most likely cause of intermittant bleeding. If recurs.. Try topical steroid OTC hemorrhoid treatment , if persistent.. Call for referral to GI for earlier colonoscopy evaluation.

## 2017-10-16 NOTE — Progress Notes (Signed)
Subjective:    Patient ID: [name] [name], female    DOB: 1955/01/26, 62 y.o.   MRN: [id number]  HPI  The patient is here for annual wellness exam and preventative care.    Feeling well overall.  Reviewed labs in detail. Diet: healthy eating  Exercise;  regular exercise. Body mass index is 22.96 kg/m.  Wt Readings from Last 3 Encounters:  10/16/17 123 lb 8 oz (56 kg)  06/05/17 123 lb (55.8 kg)  01/26/17 125 lb (56.7 kg)   Chronic migraine:On topamax  100 mg daily. On magnesium... Has looser stools.  Occ blood on toilet tissue.. Occurring once a month x 6 months. Mildly painful at times... stingy Last 1 day then stops.  Nml colonscopy 2011   Social History /Family History/Past Medical History reviewed in detail and updated in EMR if needed. Blood pressure 112/65, pulse (!) 53, temperature 97.8 F (36.6 C), temperature source Oral, height 5' 1.5" (1.562 m), weight 123 lb 8 oz (56 kg).  Review of Systems  Constitutional: Negative for fatigue and fever.  HENT: Negative for congestion.   Eyes: Negative for pain.  Respiratory: Negative for cough and shortness of breath.   Cardiovascular: Negative for chest pain, palpitations and leg swelling.  Gastrointestinal: Positive for blood in stool. Negative for abdominal pain.  Genitourinary: Negative for dysuria and vaginal bleeding.  Musculoskeletal: Negative for back pain.  Neurological: Negative for syncope, light-headedness and headaches.  Psychiatric/Behavioral: Negative for dysphoric mood.       Objective:   Physical Exam  Constitutional: Vital signs are normal. She appears well-developed and well-nourished. She is cooperative.  Non-toxic appearance. She does not appear ill. No distress.  HENT:  Head: Normocephalic.  Right Ear: Hearing, tympanic membrane, external ear and ear canal normal.  Left Ear: Hearing, tympanic membrane, external ear and ear canal normal.  Nose: Nose normal.  Eyes: Pupils are equal, round, and  reactive to light. Conjunctivae, EOM and lids are normal. Lids are everted and swept, no foreign bodies found.  Neck: Trachea normal and normal range of motion. Neck supple. Carotid bruit is not present. No thyroid mass and no thyromegaly present.  Cardiovascular: Normal rate, regular rhythm, S1 normal, S2 normal, normal heart sounds and intact distal pulses. Exam reveals no gallop.  No murmur heard. Pulmonary/Chest: Effort normal and breath sounds normal. No respiratory distress. She has no wheezes. She has no rhonchi. She has no rales.  Abdominal: Soft. Normal appearance and bowel sounds are normal. She exhibits no distension, no fluid wave, no abdominal bruit and no mass. There is no hepatosplenomegaly. There is no tenderness. There is no rebound, no guarding and no CVA tenderness. No hernia.  Lymphadenopathy:    She has no cervical adenopathy.    She has no axillary adenopathy.  Neurological: She is alert. She has normal strength. No cranial nerve deficit or sensory deficit.  Skin: Skin is warm, dry and intact. No rash noted.  Psychiatric: Her speech is normal and behavior is normal. Judgment normal. Her mood appears not anxious. Cognition and memory are normal. She does not exhibit a depressed mood.    PROCEDURE NOTE:  After explaining the procedure, informed consent was obtained.   Using the anoscope instrument, anoscopy was carried out.  Proceeded to 2-3 cm.  Findings: external hemorrhoids noted, no biopsies taken, procedure well tolerated without complications.  No complications were encountered;  the procedure was well  tolerated.    ASSESSMENT:  Anoscopy to evaluated cause of blood in  stool: results showed small non bleeding external hemmorhoids. No current bleeding.          Assessment & Plan:  The patient's preventative maintenance and recommended screening tests for an annual wellness exam were reviewed in full today. Brought up to date unless services  declined.  Counselled on the importance of diet, exercise, and its role in overall health and mortality. The patient's FH and SH was reviewed, including their home life, tobacco status, and drug and alcohol status.   Vaccines: uptodate with Tdap, flu given today, will consider shingles.  Mammo: last nml 12/2016 PAP/DVE: last pap nml 2016, on every 5 year schedule pap, dve not indicated Colon: 03/2009 small polyp, repeat in 10 years.  Nonsmoker  Refuses HIV. Hep C screen: neg DEXA: 04/2014. Osteopenia, repeat in 5 years. Family hx of osteoporosis and frequent pred use.

## 2017-10-16 NOTE — Assessment & Plan Note (Signed)
Stable on topamax.   

## 2017-10-16 NOTE — Patient Instructions (Signed)
Anoscopy showed  Ext hemorrhoids, small and not irritated... Most likely cause of intermittant bleeding.  If recurs.. Try topical steroid OTC hemorrhoid treatment , if persistent.. Call for referral to GI for earlier colonoscopy evaluation.

## 2017-10-22 DIAGNOSIS — J301 Allergic rhinitis due to pollen: Secondary | ICD-10-CM | POA: Diagnosis not present

## 2017-10-29 DIAGNOSIS — J301 Allergic rhinitis due to pollen: Secondary | ICD-10-CM | POA: Diagnosis not present

## 2017-11-05 DIAGNOSIS — J301 Allergic rhinitis due to pollen: Secondary | ICD-10-CM | POA: Diagnosis not present

## 2017-11-06 MED ORDER — TRIAMCINOLONE ACETONIDE 0.1 % EX CREA: 1.0000 "application " | TOPICAL_CREAM | Freq: Two times a day (BID) | CUTANEOUS | 0 refills | Status: AC

## 2017-11-19 DIAGNOSIS — J301 Allergic rhinitis due to pollen: Secondary | ICD-10-CM | POA: Diagnosis not present

## 2017-11-20 DIAGNOSIS — J301 Allergic rhinitis due to pollen: Secondary | ICD-10-CM | POA: Diagnosis not present

## 2017-11-25 ENCOUNTER — Other Ambulatory Visit: Payer: Self-pay | Admitting: Family Medicine

## 2017-11-26 DIAGNOSIS — J301 Allergic rhinitis due to pollen: Secondary | ICD-10-CM | POA: Diagnosis not present

## 2017-12-02 IMAGING — DX DG SINUSES COMPLETE 3+V
5 series · 5 of 5 positions shown · non-contrast
Comparison: None.

CLINICAL DATA: Sinus pressure for 4 days

EXAM:
PARANASAL SINUSES - COMPLETE 3 + VIEW

[waters]
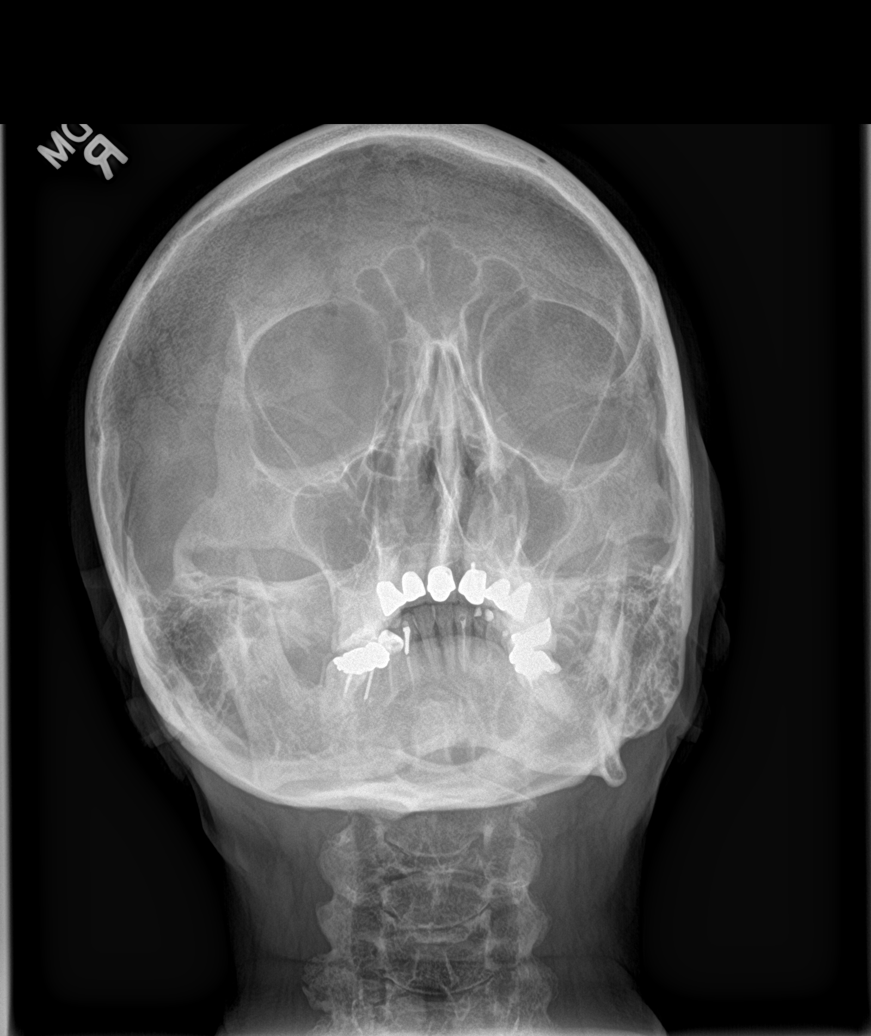

[[person_name]]
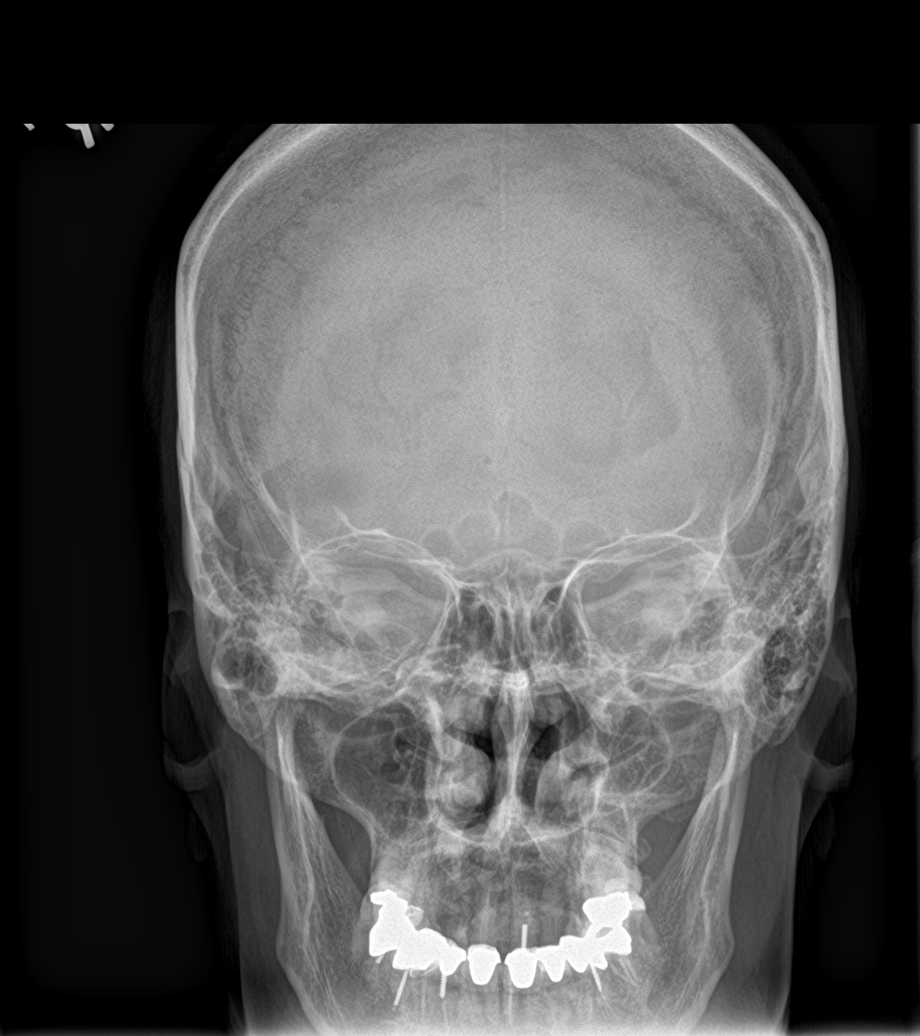

[lateral (1 of 2)]
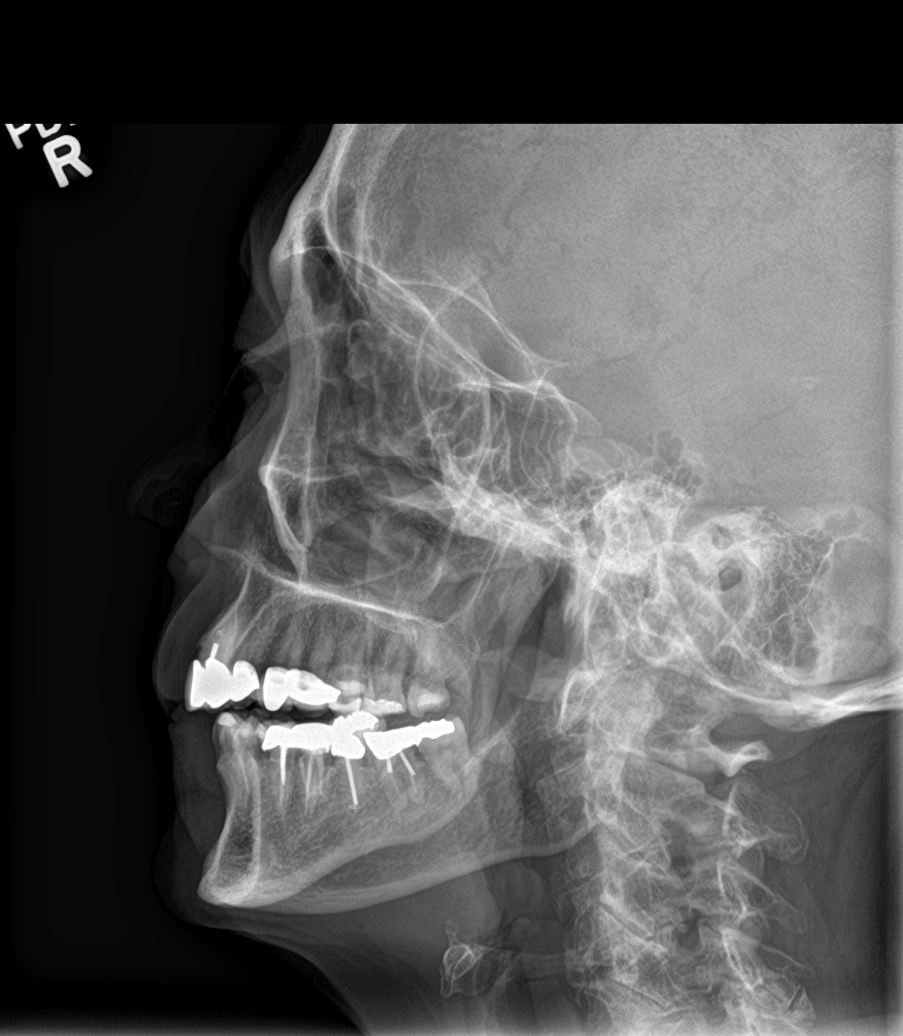

[smv]
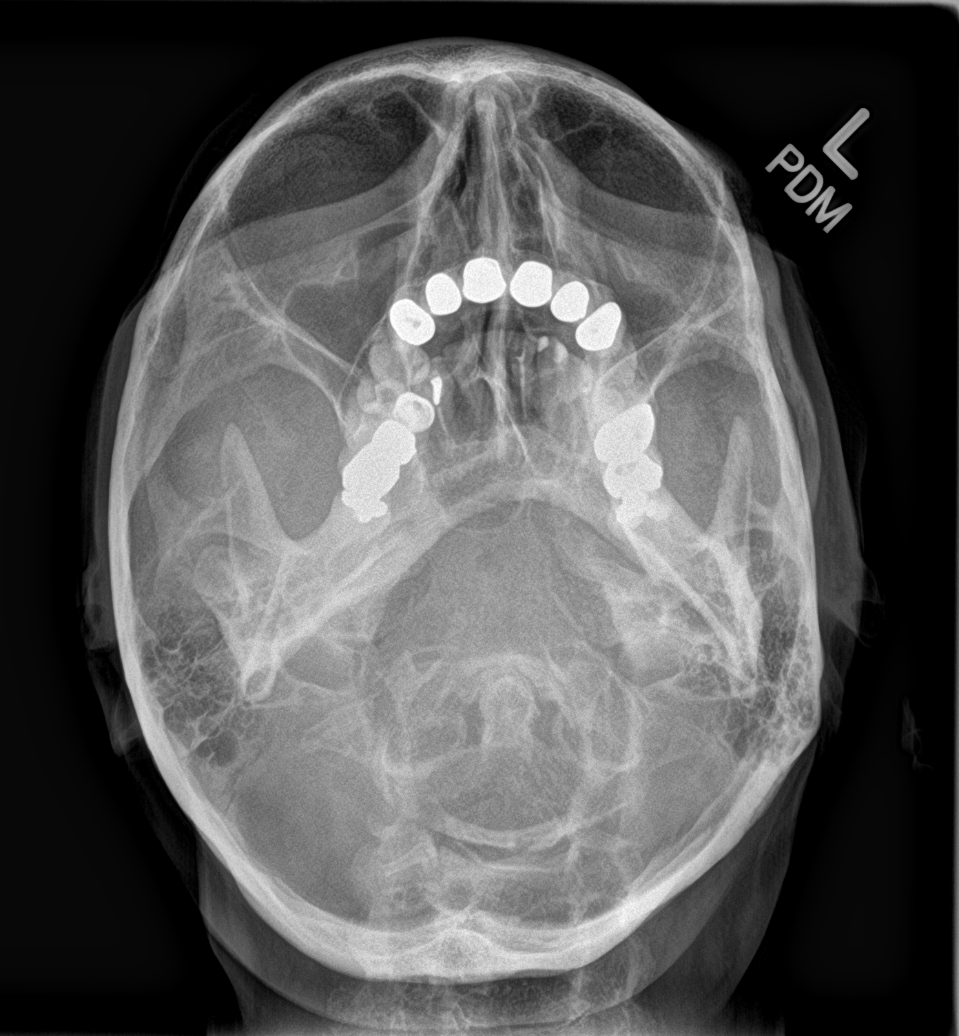

[lateral (2 of 2)]
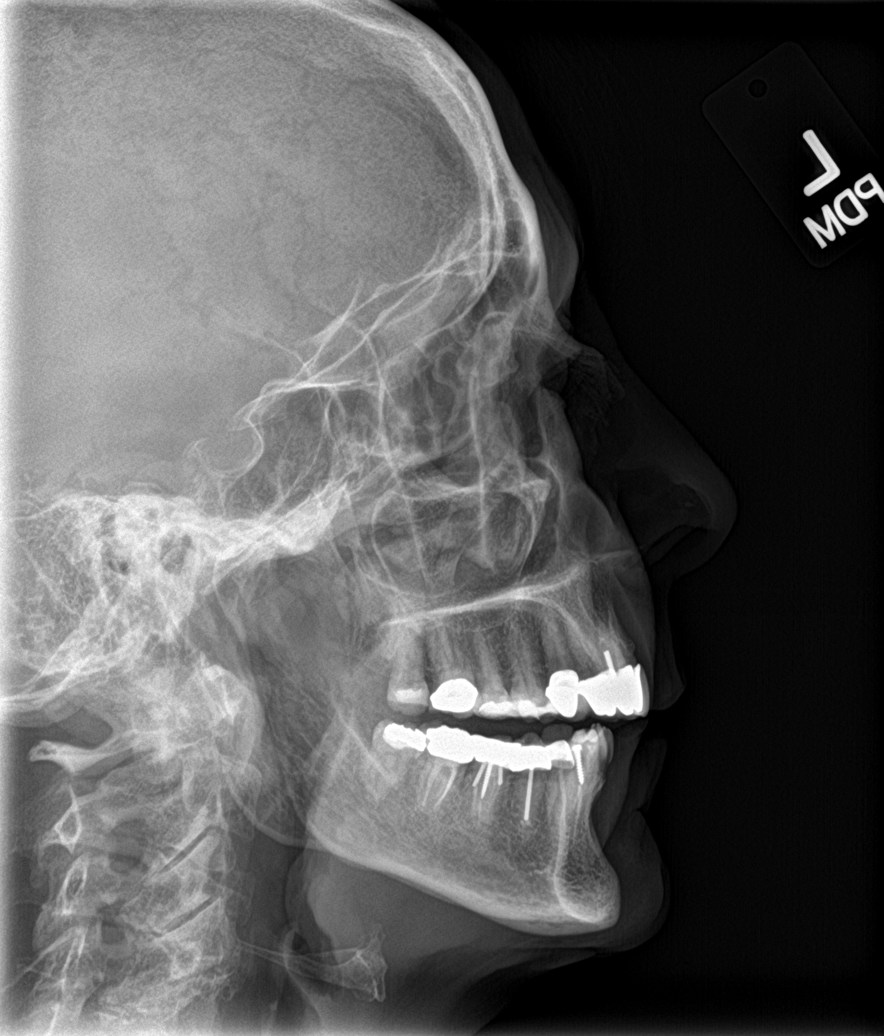

[5 of 5 positions shown; findings below may reference images not displayed]

FINDINGS: The paranasal sinus are aerated. There is no evidence of sinus
opacification air-fluid levels or mucosal thickening. No significant
bone abnormalities are seen.
IMPRESSION: Negative.

## 2017-12-10 DIAGNOSIS — J301 Allergic rhinitis due to pollen: Secondary | ICD-10-CM | POA: Diagnosis not present

## 2017-12-24 DIAGNOSIS — J301 Allergic rhinitis due to pollen: Secondary | ICD-10-CM | POA: Diagnosis not present

## 2018-01-07 DIAGNOSIS — J301 Allergic rhinitis due to pollen: Secondary | ICD-10-CM | POA: Diagnosis not present

## 2018-01-08 DIAGNOSIS — J301 Allergic rhinitis due to pollen: Secondary | ICD-10-CM | POA: Diagnosis not present

## 2018-01-20 DIAGNOSIS — J019 Acute sinusitis, unspecified: Secondary | ICD-10-CM | POA: Diagnosis not present

## 2018-01-20 DIAGNOSIS — R42 Dizziness and giddiness: Secondary | ICD-10-CM | POA: Diagnosis not present

## 2018-01-21 DIAGNOSIS — J301 Allergic rhinitis due to pollen: Secondary | ICD-10-CM | POA: Diagnosis not present

## 2018-01-28 DIAGNOSIS — J301 Allergic rhinitis due to pollen: Secondary | ICD-10-CM | POA: Diagnosis not present

## 2018-02-10 DIAGNOSIS — J301 Allergic rhinitis due to pollen: Secondary | ICD-10-CM | POA: Diagnosis not present

## 2018-02-25 DIAGNOSIS — J301 Allergic rhinitis due to pollen: Secondary | ICD-10-CM | POA: Diagnosis not present

## 2018-03-04 DIAGNOSIS — J301 Allergic rhinitis due to pollen: Secondary | ICD-10-CM | POA: Diagnosis not present

## 2018-03-17 DIAGNOSIS — J301 Allergic rhinitis due to pollen: Secondary | ICD-10-CM | POA: Diagnosis not present

## 2018-03-24 DIAGNOSIS — J301 Allergic rhinitis due to pollen: Secondary | ICD-10-CM | POA: Diagnosis not present

## 2018-04-23 DIAGNOSIS — J301 Allergic rhinitis due to pollen: Secondary | ICD-10-CM | POA: Diagnosis not present

## 2018-04-29 DIAGNOSIS — J301 Allergic rhinitis due to pollen: Secondary | ICD-10-CM | POA: Diagnosis not present

## 2018-05-06 DIAGNOSIS — J301 Allergic rhinitis due to pollen: Secondary | ICD-10-CM | POA: Diagnosis not present

## 2018-05-13 DIAGNOSIS — J301 Allergic rhinitis due to pollen: Secondary | ICD-10-CM | POA: Diagnosis not present

## 2018-05-14 ENCOUNTER — Other Ambulatory Visit: Payer: Self-pay | Admitting: Family Medicine

## 2018-05-20 DIAGNOSIS — J301 Allergic rhinitis due to pollen: Secondary | ICD-10-CM | POA: Diagnosis not present

## 2018-05-27 DIAGNOSIS — J301 Allergic rhinitis due to pollen: Secondary | ICD-10-CM | POA: Diagnosis not present

## 2018-06-10 DIAGNOSIS — J301 Allergic rhinitis due to pollen: Secondary | ICD-10-CM | POA: Diagnosis not present

## 2018-06-17 DIAGNOSIS — J301 Allergic rhinitis due to pollen: Secondary | ICD-10-CM | POA: Diagnosis not present

## 2018-06-24 DIAGNOSIS — J301 Allergic rhinitis due to pollen: Secondary | ICD-10-CM | POA: Diagnosis not present

## 2018-07-01 DIAGNOSIS — J301 Allergic rhinitis due to pollen: Secondary | ICD-10-CM | POA: Diagnosis not present

## 2018-07-14 DIAGNOSIS — J301 Allergic rhinitis due to pollen: Secondary | ICD-10-CM | POA: Diagnosis not present

## 2018-07-21 DIAGNOSIS — J301 Allergic rhinitis due to pollen: Secondary | ICD-10-CM | POA: Diagnosis not present

## 2018-07-23 DIAGNOSIS — J301 Allergic rhinitis due to pollen: Secondary | ICD-10-CM | POA: Diagnosis not present

## 2018-08-03 ENCOUNTER — Other Ambulatory Visit: Payer: Self-pay | Admitting: Family Medicine

## 2018-08-05 DIAGNOSIS — J301 Allergic rhinitis due to pollen: Secondary | ICD-10-CM | POA: Diagnosis not present

## 2018-08-31 ENCOUNTER — Other Ambulatory Visit: Payer: Self-pay | Admitting: Family Medicine

## 2018-08-31 MED ORDER — TOPIRAMATE 100 MG PO TABS: 100.0000 mg | ORAL_TABLET | Freq: Every day | ORAL | 0 refills | Status: DC

## 2018-08-31 NOTE — Telephone Encounter (Signed)
I will refill for 3 months, but she will be due for CPX after 10/2018.  We could do this virtually if she has moved if she has not yet set up new PCP.

## 2018-08-31 NOTE — Telephone Encounter (Signed)
[place].  Patient is concerned about her rx for Topiramate.  Patient said she found out suddenly that she had to move.  Patient wants to know if she'll be able to get her medication refilled until she can find a new doctor.

## 2018-09-01 NOTE — Telephone Encounter (Signed)
Ms. Jakel notified as instructed by telephone.  Patient states understanding.

## 2018-11-30 ENCOUNTER — Other Ambulatory Visit: Payer: Self-pay | Admitting: Family Medicine

## 2018-11-30 NOTE — Telephone Encounter (Signed)
migrane follow up 12/3 Pt did move to [place] but has not found a pcp there yet

## 2018-11-30 NOTE — Telephone Encounter (Signed)
Please schedule CPE with fasting labs or follow up migraines appointment.  Please send back to me to refill medication.  The refill is coming from [place], so I am wondering if patient has moved.

## 2018-12-09 ENCOUNTER — Encounter: Payer: Self-pay | Admitting: Family Medicine

## 2018-12-09 ENCOUNTER — Ambulatory Visit (INDEPENDENT_AMBULATORY_CARE_PROVIDER_SITE_OTHER): Payer: BC Managed Care – PPO | Admitting: Family Medicine

## 2018-12-09 VITALS — Ht 61.5 in | Wt 118.0 lb

## 2018-12-09 DIAGNOSIS — B009 Herpesviral infection, unspecified: Secondary | ICD-10-CM

## 2018-12-09 DIAGNOSIS — IMO0002 Reserved for concepts with insufficient information to code with codable children: Secondary | ICD-10-CM

## 2018-12-09 DIAGNOSIS — G43709 Chronic migraine without aura, not intractable, without status migrainosus: Secondary | ICD-10-CM | POA: Diagnosis not present

## 2018-12-09 MED ORDER — TOPIRAMATE 100 MG PO TABS: ORAL_TABLET | ORAL | 5 refills | Status: AC

## 2018-12-09 MED ORDER — VALACYCLOVIR HCL 500 MG PO TABS: ORAL_TABLET | ORAL | 1 refills | Status: AC

## 2018-12-09 MED ORDER — SUMATRIPTAN SUCCINATE 100 MG PO TABS: ORAL_TABLET | ORAL | 0 refills | Status: AC

## 2018-12-09 NOTE — Patient Instructions (Signed)
Set up new PCP  For physical in next 3-4 months.  In 03/2019 you will be due for mammogram, colonoscopy, bone density and  your last pap smear!  Good luck to you both in [place]!

## 2018-12-09 NOTE — Progress Notes (Signed)
VIRTUAL VISIT Due to national recommendations of social distancing due to [place] 19, a virtual visit is felt to be most appropriate for this patient at this time.   I connected with the patient on 12/09/18 at 12:00 PM EST by virtual telehealth platform and verified that I am speaking with the correct person using two identifiers.   I discussed the limitations, risks, security and privacy concerns of performing an evaluation and management service by  virtual telehealth platform and the availability of in person appointments. I also discussed with the patient that there may be a patient responsible charge related to this service. The patient expressed understanding and agreed to proceed.  Patient location: Home Provider Location: [place] Participants: Eliezer Lofts and Merceda Elks   Chief Complaint  Patient presents with  . Medication Refill    Moved to [place] but has not established with new PCP    History of Present Illness: 63 year old female presents for follow up migraines  She reports migraine are occurring:  1 in last months  Compliant with meds. Acute meds: sumatriptan Preventative meds: Topiramate 100 mg daily  no SE  no neuro changes  There are no associated abnormal neurological symptoms such as TIA's, loss of balance, loss of vision or speech, numbness or weakness on review. Past neurological history: negative for stroke, MS, epilepsy, or brain tumor.    COVID 19 screen No recent travel or known exposure to COVID19 The patient denies respiratory symptoms of COVID 19 at this time.  The importance of social distancing was discussed today.   Review of Systems  Constitutional: Negative for chills and fever.  HENT: Negative for congestion and ear pain.   Eyes: Negative for pain and redness.  Respiratory: Negative for cough and shortness of breath.   Cardiovascular: Negative for chest pain, palpitations and leg swelling.  Gastrointestinal: Negative for  abdominal pain, blood in stool, constipation, diarrhea, nausea and vomiting.  Genitourinary: Negative for dysuria.  Musculoskeletal: Negative for falls and myalgias.  Skin: Negative for rash.  Neurological: Negative for dizziness.  Psychiatric/Behavioral: Negative for depression. The patient is not nervous/anxious.       Past Medical History:  Diagnosis Date  . Alcoholism ([place])   . Allergy   . Headache(784.0)     reports that she has never smoked. She has never used smokeless tobacco. She reports that she does not drink alcohol or use drugs.   Current Outpatient Medications:  .  calcium carbonate (OS-CAL) 600 MG TABS, Take 600 mg by mouth daily.  , Disp: , Rfl:  .  loratadine (CLARITIN) 10 MG tablet, Take 10 mg by mouth as needed. , Disp: , Rfl:  .  Magnesium 500 MG TABS, Take 650 mg by mouth daily., Disp: , Rfl:  .  Multiple Vitamin (MULTIVITAMIN) tablet, Take 1 tablet by mouth daily.  , Disp: , Rfl:  .  omeprazole (PRILOSEC) 40 MG capsule, Take 40 mg by mouth daily., Disp: , Rfl:  .  SUMAtriptan (IMITREX) 100 MG tablet, TAKE AS DIRECTED. MAY REPEAT AFTER 2 HOURS, Disp: 12 tablet, Rfl: 0 .  topiramate (TOPAMAX) 100 MG tablet, TAKE 1 TABLET(100 MG) BY MOUTH AT BEDTIME, Disp: 30 tablet, Rfl: 0 .  triamcinolone cream (KENALOG) 0.1 %, Apply 1 application topically 2 (two) times daily., Disp: 30 g, Rfl: 0 .  valACYclovir (VALTREX) 500 MG tablet, 500 mg twice daily for 3 days for flare ups, Disp: 30 tablet, Rfl: 1  Current Facility-Administered Medications:  .  0.9 %  sodium chloride infusion, 500 mL, Intravenous, Continuous, Nandigam, Kavitha V, MD   Observations/Objective: Height 5' 1.5" (1.562 m), weight 118 lb (53.5 kg), SpO2 92 %.  Physical Exam  Physical Exam Constitutional:      General: The patient is not in acute distress. Pulmonary:     Effort: Pulmonary effort is normal. No respiratory distress.  Neurological:     Mental Status: The patient is alert and oriented to  person, place, and time.  Psychiatric:        Mood and Affect: Mood normal.        Behavior: Behavior normal.   Assessment and Plan   Chronic migraine  Well controlled on topiramate.. refilled x 6 months. Acute treatment sumatriptan refilled as well.  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   I discussed the assessment and treatment plan with the patient. The patient was provided an opportunity to ask questions and all were answered. The patient agreed with the plan and demonstrated an understanding of the instructions.   The patient was advised to call back or seek an in-person evaluation if the symptoms worsen or if the condition fails to improve as anticipated.     Kerby Nora, MD

## 2018-12-09 NOTE — Assessment & Plan Note (Signed)
Valacyclovir refilled

## 2018-12-09 NOTE — Assessment & Plan Note (Signed)
Well controlled on topiramate.. refilled x 6 months. Acute treatment sumatriptan refilled as well.

## 2019-06-08 ENCOUNTER — Other Ambulatory Visit: Payer: Self-pay | Admitting: Family Medicine

## 2019-06-28 ENCOUNTER — Other Ambulatory Visit: Payer: Self-pay | Admitting: Family Medicine
